# Patient Record
Sex: Male | Born: 1990 | Race: Black or African American | Hispanic: No | Marital: Married | Smoking: Never smoker
Health system: Southern US, Community
[De-identification: ages and names within clinical notes are randomized; demographics above are authoritative.]

---
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
## 2011-01-04 ENCOUNTER — Inpatient Hospital Stay (INDEPENDENT_AMBULATORY_CARE_PROVIDER_SITE_OTHER)
Admission: RE | Admit: 2011-01-04 | Discharge: 2011-01-04 | Disposition: A | Discharge status: Home / Self Care | Payer: Self-pay | Source: Ambulatory Visit | Attending: Emergency Medicine | Admitting: Emergency Medicine

## 2011-01-04 DIAGNOSIS — L738 Other specified follicular disorders: Secondary | ICD-10-CM

## 2011-01-04 DIAGNOSIS — R369 Urethral discharge, unspecified: Secondary | ICD-10-CM

## 2011-01-06 LAB — GC/CHLAMYDIA PROBE AMP, GENITAL: Chlamydia, DNA Probe: NEGATIVE

## 2011-04-10 ENCOUNTER — Emergency Department: Payer: Self-pay | Admitting: *Deleted

## 2011-06-01 LAB — GC/CHLAMYDIA PROBE AMP, GENITAL
Chlamydia, DNA Probe: NEGATIVE
GC Probe Amp, Genital: POSITIVE — AB

## 2011-06-01 LAB — HIV ANTIBODY (ROUTINE TESTING W REFLEX): HIV: NONREACTIVE

## 2012-09-08 ENCOUNTER — Encounter (HOSPITAL_COMMUNITY): Payer: Self-pay | Admitting: Emergency Medicine

## 2012-09-08 ENCOUNTER — Emergency Department (HOSPITAL_COMMUNITY)
Admission: EM | Admit: 2012-09-08 | Discharge: 2012-09-08 | Disposition: A | Discharge status: Home / Self Care | Payer: Self-pay | Attending: Emergency Medicine | Admitting: Emergency Medicine

## 2012-09-08 ENCOUNTER — Emergency Department (HOSPITAL_COMMUNITY): Payer: Self-pay

## 2012-09-08 DIAGNOSIS — R0789 Other chest pain: Secondary | ICD-10-CM | POA: Insufficient documentation

## 2012-09-08 NOTE — ED Provider Notes (Signed)
History     CSN: [id number]  Arrival date & time 09/08/12  1953   First MD Initiated Contact with Patient 09/08/12 2003      Chief Complaint  Patient presents with  . Chest Pain    (Consider location/radiation/quality/duration/timing/severity/associated sxs/prior treatment) HPI  Alan Sherman is a 22 y.o. male complaining of acute onset of chest pain earlier in the day while doing pushups. Patient had been taking powdered protein and vitamins and then proceeded to pushups. He describes the pain as 6/10, pressure like to the left upper chest that lasted for 2 minutes it is now fully resolved. Pain was associated with dizziness. Patient denies any shortness of breath, nausea vomiting, family history of early cardiac death, abdominal pain, palpitations, numbness, cocaine abuse.  Marland KitchenHistory reviewed. No pertinent past medical history.  History reviewed. No pertinent past surgical history.  No family history on file.  History  Substance Use Topics  . Smoking status: Not on file  . Smokeless tobacco: Not on file  . Alcohol Use: Not on file      Review of Systems  Constitutional: Negative for fever.  Respiratory: Negative for shortness of breath.   Cardiovascular: Negative for chest pain.  Gastrointestinal: Negative for nausea, vomiting, abdominal pain and diarrhea.  All other systems reviewed and are negative.    Allergies  Review of patient's allergies indicates no known allergies.  Home Medications   Current Outpatient Rx  Name  Route  Sig  Dispense  Refill  . ADULT MULTIVITAMIN W/MINERALS CH   Oral   Take 1 tablet by mouth daily.           There were no vitals taken for this visit.  Physical Exam  Nursing note and vitals reviewed. Constitutional: He is oriented to person, place, and time. He appears well-developed and well-nourished. No distress.  HENT:  Head: Normocephalic.  Eyes: Conjunctivae normal and EOM are normal.  Neck: Normal range of motion. Neck  supple. No JVD present.  Cardiovascular: Normal rate.   Pulmonary/Chest: Effort normal and breath sounds normal. No stridor. No respiratory distress. He has no wheezes. He has no rales. He exhibits no tenderness.  Musculoskeletal: Normal range of motion. He exhibits no edema.  Neurological: He is alert and oriented to person, place, and time.  Psychiatric: He has a normal mood and affect.    ED Course  Procedures (including critical care time)  Labs Reviewed - No data to display Dg Chest 2 View  09/08/2012  *RADIOLOGY REPORT*  Clinical Data: Chest pain.  CHEST - 2 VIEW  Comparison: 12/24/2010  Findings: Cardiac and mediastinal contours appear normal.  The lungs appear clear.  No pleural effusion is identified.  IMPRESSION:  No significant abnormality identified.   Original Report Authenticated By: Gaylyn Rong, M.D.      Date: 09/08/2012  Rate: 71  Rhythm: normal sinus rhythm  QRS Axis: normal  Intervals: normal  ST/T Wave abnormalities: normal  Conduction Disutrbances:none  Narrative Interpretation:   Old EKG Reviewed: unchanged   1. Atypical chest pain       MDM  Patient with no cardiac risk factors with 2 minutes of chest pain earlier in the day. Vital signs stable, EKG and chest x-ray are normal. Likely muscular skeletal in nature. Patient will be discharged with return precautions given.       Wynetta Emery, PA-C 09/08/12 2127

## 2012-09-08 NOTE — ED Notes (Signed)
Pt arrived by EMS. Pt c/o CP while doing push ups. Denies any CP at this time. Pt uncle told EMS that he smoked pot today but pt denies. Pt stated while doing push ups he heard a pop and when the CP started. Stated that he had some dizziness at the time of CP. Cp lasted approx 2 mins and went away. Pt also had a sharp CP that came and went en route to the hospital. EMS stated that pt had taken [employer] supplements and a extreme 4 supplement for the first time today. Pt denies N/V SOB.  EMS VS: BP-161/85 HR-84 O2sat- 100% RA.

## 2012-09-09 NOTE — ED Provider Notes (Signed)
Medical screening examination/treatment/procedure(s) were performed by non-physician practitioner and as supervising physician I was immediately available for consultation/collaboration.   Dam Ashraf L Gloristine Turrubiates, MD 09/09/12 1237 

## 2012-11-25 ENCOUNTER — Encounter (HOSPITAL_COMMUNITY): Payer: Self-pay | Admitting: *Deleted

## 2012-11-25 DIAGNOSIS — R51 Headache: Secondary | ICD-10-CM | POA: Insufficient documentation

## 2012-11-25 DIAGNOSIS — J3489 Other specified disorders of nose and nasal sinuses: Secondary | ICD-10-CM | POA: Insufficient documentation

## 2012-11-25 DIAGNOSIS — Z79899 Other long term (current) drug therapy: Secondary | ICD-10-CM | POA: Insufficient documentation

## 2012-11-25 NOTE — ED Notes (Signed)
Pt state HA that wont go away for a week now. Pt states that he has been trying OTC medications for the HA and that it will be severe and the right side of his head will hurt and eye will go blurry. Pt attempts to take medicine and he feels nauseated. Pt has no neuro deficits.

## 2012-11-26 ENCOUNTER — Emergency Department (HOSPITAL_COMMUNITY)
Admission: EM | Admit: 2012-11-26 | Discharge: 2012-11-26 | Disposition: A | Discharge status: Home / Self Care | Payer: Self-pay | Attending: Emergency Medicine | Admitting: Emergency Medicine

## 2012-11-26 DIAGNOSIS — R0981 Nasal congestion: Secondary | ICD-10-CM

## 2012-11-26 MED ORDER — IBUPROFEN 800 MG PO TABS
800.0000 mg | ORAL_TABLET | Freq: Once | ORAL | Status: AC
  Administered 2012-11-26: 800 mg via ORAL
  Filled 2012-11-26: qty 1

## 2012-11-26 MED ORDER — MOMETASONE FUROATE 50 MCG/ACT NA SUSP: 2.0000 | Freq: Every day | NASAL | Status: AC

## 2012-11-26 MED ORDER — TRAMADOL HCL 50 MG PO TABS
50.0000 mg | ORAL_TABLET | Freq: Once | ORAL | Status: AC
  Administered 2012-11-26: 50 mg via ORAL
  Filled 2012-11-26: qty 1

## 2012-11-26 MED ORDER — PSEUDOEPHEDRINE HCL ER 120 MG PO TB12: 120.0000 mg | ORAL_TABLET | Freq: Two times a day (BID) | ORAL | Status: AC

## 2012-11-26 MED ORDER — OXYMETAZOLINE HCL 0.05 % NA SOLN
1.0000 | Freq: Once | NASAL | Status: AC
  Administered 2012-11-26: 1 via NASAL
  Filled 2012-11-26: qty 15

## 2012-11-26 MED ORDER — IBUPROFEN 800 MG PO TABS: 800.0000 mg | ORAL_TABLET | Freq: Four times a day (QID) | ORAL | Status: AC | PRN

## 2012-11-26 MED ORDER — OXYMETAZOLINE HCL 0.05 % NA SOLN: 2.0000 | Freq: Two times a day (BID) | NASAL | Status: AC

## 2012-11-26 MED ORDER — PSEUDOEPHEDRINE HCL ER 120 MG PO TB12
120.0000 mg | ORAL_TABLET | ORAL | Status: AC
  Administered 2012-11-26: 120 mg via ORAL
  Filled 2012-11-26: qty 1

## 2012-11-26 MED ORDER — TRAMADOL HCL 50 MG PO TABS: 50.0000 mg | ORAL_TABLET | Freq: Three times a day (TID) | ORAL | Status: AC | PRN

## 2012-11-26 NOTE — ED Notes (Addendum)
Denies: sore throat.  throat mildly red and irritated, no obvious exudate, reports clear nasal congestion & sputum. C/o R sided HA and R facial pain tenderness, effecting R nose, ear and eye. Also, denies fever, nausea, dizziness or other sx.  Friend at [facility].

## 2012-11-26 NOTE — ED Notes (Addendum)
Back into room

## 2012-11-26 NOTE — ED Notes (Signed)
Pt not in room, went to w/r to get phone from uncle. Alert, NAD, calm, interactive, resps e/u, speaking in clear complete sentences, steady gait.

## 2012-11-26 NOTE — ED Provider Notes (Signed)
History     CSN: [id number]  Arrival date & time 11/25/12  2127   First MD Initiated Contact with Patient 11/26/12 0226      Chief Complaint  Patient presents with  . Headache    (Consider location/radiation/quality/duration/timing/severity/associated sxs/prior treatment) HPI 22 yo male presents to the ER with complaint of right sided headache x 1 week.  No fevers, chills, neck pain or stiffness.  Pt reports h/o allergies this time of year, and has had rhinorrhea.  Pain is over right eye, right lateral face, and ear.  He has tried OTC pain medications without improvement.  No allergy or decongestion medicine tried.  No neurologic complaints.  No photophobia, no phonophobia, no nausea, no tearing.    History reviewed. No pertinent past medical history.  History reviewed. No pertinent past surgical history.  History reviewed. No pertinent family history.  History  Substance Use Topics  . Smoking status: Never Smoker   . Smokeless tobacco: Not on file  . Alcohol Use: No      Review of Systems  All other systems reviewed and are negative.    Allergies  Review of patient's allergies indicates no known allergies.  Home Medications   Current Outpatient Rx  Name  Route  Sig  Dispense  Refill  . Multiple Vitamin (MULTIVITAMIN WITH MINERALS) TABS   Oral   Take 1 tablet by mouth daily.         Marland Kitchen ibuprofen (ADVIL,MOTRIN) 800 MG tablet   Oral   Take 1 tablet (800 mg total) by mouth every 6 (six) hours as needed for pain.   30 tablet   0   . mometasone (NASONEX) 50 MCG/ACT nasal spray   Nasal   Place 2 sprays into the nose daily.   17 g   12   . oxymetazoline (AFRIN) 0.05 % nasal spray   Nasal   Place 2 sprays into the nose 2 (two) times daily. For no more than 3 days   30 mL   0   . pseudoephedrine (SUDAFED) 120 MG 12 hr tablet   Oral   Take 1 tablet (120 mg total) by mouth every 12 (twelve) hours.   30 tablet   0   . traMADol (ULTRAM) 50 MG tablet  Oral   Take 1 tablet (50 mg total) by mouth every 8 (eight) hours as needed for pain.   30 tablet   0     BP 139/71  Pulse 56  Temp(Src) 98 F (36.7 C) (Oral)  Resp 16  SpO2 100%  Physical Exam  Nursing note and vitals reviewed. Constitutional: He is oriented to person, place, and time. He appears well-developed and well-nourished.  HENT:  Head: Normocephalic and atraumatic.  Left Ear: External ear normal.  Nose: Nose normal.  Mouth/Throat: Oropharynx is clear and moist.  Clear fluid behind right TM.  Pain with percussion over right frontal and maxillary sinus.  Inflamed turbinates bilaterally  Eyes: Conjunctivae and EOM are normal. Pupils are equal, round, and reactive to light.  Neck: Normal range of motion. Neck supple. No JVD present. No tracheal deviation present. No thyromegaly present.  Cardiovascular: Normal rate, regular rhythm, normal heart sounds and intact distal pulses.  Exam reveals no gallop and no friction rub.   No murmur heard. Pulmonary/Chest: Effort normal and breath sounds normal. No stridor. No respiratory distress. He has no wheezes. He has no rales. He exhibits no tenderness.  Abdominal: Soft. Bowel sounds are normal. He exhibits no distension  and no mass. There is no tenderness. There is no rebound and no guarding.  Musculoskeletal: Normal range of motion. He exhibits no edema and no tenderness.  Lymphadenopathy:    He has no cervical adenopathy.  Neurological: He is oriented to person, place, and time. He has normal reflexes. No cranial nerve deficit. He exhibits normal muscle tone. Coordination normal.  Skin: Skin is dry. No rash noted. No erythema. No pallor.  Psychiatric: He has a normal mood and affect. His behavior is normal. Judgment and thought content normal.    ED Course  Procedures (including critical care time)  Labs Reviewed - No data to display No results found.   1. Sinus headache   2. Sinus congestion       MDM  22 yo male  with headache, most likely due to sinus pressure.  Rx given.  No signs of meningitis, migraine, or serious infection.        Olivia Mackie, MD 11/26/12 2036

## 2015-06-30 ENCOUNTER — Encounter: Payer: Self-pay | Admitting: Emergency Medicine

## 2015-06-30 ENCOUNTER — Emergency Department
Admission: EM | Admit: 2015-06-30 | Discharge: 2015-06-30 | Disposition: A | Discharge status: Home / Self Care | Payer: Self-pay | Attending: Emergency Medicine | Admitting: Emergency Medicine

## 2015-06-30 DIAGNOSIS — B349 Viral infection, unspecified: Secondary | ICD-10-CM | POA: Insufficient documentation

## 2015-06-30 DIAGNOSIS — Z79899 Other long term (current) drug therapy: Secondary | ICD-10-CM | POA: Insufficient documentation

## 2015-06-30 DIAGNOSIS — Z7951 Long term (current) use of inhaled steroids: Secondary | ICD-10-CM | POA: Insufficient documentation

## 2015-06-30 DIAGNOSIS — L02419 Cutaneous abscess of limb, unspecified: Secondary | ICD-10-CM

## 2015-06-30 DIAGNOSIS — L02411 Cutaneous abscess of right axilla: Secondary | ICD-10-CM | POA: Insufficient documentation

## 2015-06-30 LAB — CBC WITH DIFFERENTIAL/PLATELET
BASOS ABS: 0 10*3/uL (ref 0–0.1)
Basophils Relative: 0 %
Eosinophils Absolute: 0 10*3/uL (ref 0–0.7)
Eosinophils Relative: 0 %
HEMATOCRIT: 49 % (ref 40.0–52.0)
Hemoglobin: 16.3 g/dL (ref 13.0–18.0)
LYMPHS PCT: 9 %
Lymphs Abs: 0.8 10*3/uL — ABNORMAL LOW (ref 1.0–3.6)
MCH: 28.4 pg (ref 26.0–34.0)
MCHC: 33.3 g/dL (ref 32.0–36.0)
MCV: 85.1 fL (ref 80.0–100.0)
Monocytes Absolute: 0.6 10*3/uL (ref 0.2–1.0)
Monocytes Relative: 6 %
NEUTROS ABS: 8.1 10*3/uL — AB (ref 1.4–6.5)
Neutrophils Relative %: 85 %
Platelets: 133 10*3/uL — ABNORMAL LOW (ref 150–440)
RBC: 5.76 MIL/uL (ref 4.40–5.90)
RDW: 13.7 % (ref 11.5–14.5)
WBC: 9.5 10*3/uL (ref 3.8–10.6)

## 2015-06-30 LAB — BASIC METABOLIC PANEL
ANION GAP: 10 (ref 5–15)
BUN: 13 mg/dL (ref 6–20)
CO2: 28 mmol/L (ref 22–32)
Calcium: 9.8 mg/dL (ref 8.9–10.3)
Chloride: 101 mmol/L (ref 101–111)
Creatinine, Ser: 1.21 mg/dL (ref 0.61–1.24)
GFR calc Af Amer: >60 mL/min (ref >60)
GLUCOSE: 86 mg/dL (ref 65–99)
POTASSIUM: 3.5 mmol/L (ref 3.5–5.1)
SODIUM: 139 mmol/L (ref 135–145)

## 2015-06-30 LAB — URINALYSIS COMPLETE WITH MICROSCOPIC (ARMC ONLY)
Bacteria, UA: NONE SEEN
Bilirubin Urine: NEGATIVE
Glucose, UA: NEGATIVE mg/dL
Hgb urine dipstick: NEGATIVE
Leukocytes, UA: NEGATIVE
Nitrite: NEGATIVE
PROTEIN: NEGATIVE mg/dL
Specific Gravity, Urine: 1.021 (ref 1.005–1.030)
Squamous Epithelial / LPF: NONE SEEN
pH: 8 (ref 5.0–8.0)

## 2015-06-30 MED ORDER — SODIUM CHLORIDE 0.9 % IV BOLUS (SEPSIS): 1000.0000 mL | Freq: Once | INTRAVENOUS | Status: DC

## 2015-06-30 MED ORDER — ORPHENADRINE CITRATE 30 MG/ML IJ SOLN
60.0000 mg | Freq: Once | INTRAMUSCULAR | Status: AC
  Administered 2015-06-30: 60 mg via INTRAVENOUS
  Filled 2015-06-30: qty 2

## 2015-06-30 MED ORDER — TRAMADOL HCL 50 MG PO TABS: 50.0000 mg | ORAL_TABLET | Freq: Four times a day (QID) | ORAL | Status: AC | PRN

## 2015-06-30 MED ORDER — ACETAMINOPHEN 325 MG PO TABS
650.0000 mg | ORAL_TABLET | Freq: Once | ORAL | Status: AC
  Administered 2015-06-30: 650 mg via ORAL

## 2015-06-30 MED ORDER — SULFAMETHOXAZOLE-TRIMETHOPRIM 800-160 MG PO TABS: 1.0000 | ORAL_TABLET | Freq: Two times a day (BID) | ORAL | Status: DC

## 2015-06-30 MED ORDER — ACETAMINOPHEN 325 MG PO TABS
ORAL_TABLET | ORAL | Status: AC
  Filled 2015-06-30: qty 2

## 2015-06-30 MED ORDER — SODIUM CHLORIDE 0.9 % IV BOLUS (SEPSIS)
1000.0000 mL | Freq: Once | INTRAVENOUS | Status: AC
  Administered 2015-06-30: 1000 mL via INTRAVENOUS

## 2015-06-30 MED ORDER — SULFAMETHOXAZOLE-TRIMETHOPRIM 800-160 MG PO TABS
1.0000 | ORAL_TABLET | Freq: Once | ORAL | Status: AC
  Administered 2015-06-30: 1 via ORAL
  Filled 2015-06-30: qty 1

## 2015-06-30 MED ORDER — KETOROLAC TROMETHAMINE 30 MG/ML IJ SOLN
30.0000 mg | Freq: Once | INTRAMUSCULAR | Status: AC
  Administered 2015-06-30: 30 mg via INTRAVENOUS
  Filled 2015-06-30: qty 1

## 2015-06-30 MED ORDER — HYDROMORPHONE HCL 1 MG/ML IJ SOLN
1.0000 mg | Freq: Once | INTRAMUSCULAR | Status: AC
  Administered 2015-06-30: 1 mg via INTRAVENOUS
  Filled 2015-06-30: qty 1

## 2015-06-30 NOTE — Discharge Instructions (Signed)
Abscess °An abscess (boil or furuncle) is an infected area on or under the skin. This area is filled with yellowish-white fluid (pus) and other material (debris). °HOME CARE  °· Only take medicines as told by your doctor. °· If you were given antibiotic medicine, take it as directed. Finish the medicine even if you start to feel better. °· If gauze is used, follow your doctor's directions for changing the gauze. °· To avoid spreading the infection: °¨ Keep your abscess covered with a bandage. °¨ Wash your hands well. °¨ Do not share personal care items, towels, or whirlpools with others. °¨ Avoid skin contact with others. °· Keep your skin and clothes clean around the abscess. °· Keep all doctor visits as told. °GET HELP RIGHT AWAY IF:  °· You have more pain, puffiness (swelling), or redness in the wound site. °· You have more fluid or blood coming from the wound site. °· You have muscle aches, chills, or you feel sick. °· You have a fever. °MAKE SURE YOU:  °· Understand these instructions. °· Will watch your condition. °· Will get help right away if you are not doing well or get worse. °  °This information is not intended to replace advice given to you by your health care provider. Make sure you discuss any questions you have with your health care provider. °  °Document Released: 01/17/2008 Document Revised: 01/30/2012 Document Reviewed: 10/14/2011 °Elsevier Interactive Patient Education ©2016 [employer] ° °

## 2015-06-30 NOTE — ED Provider Notes (Signed)
[facility] Emergency Department Provider Note  ____________________________________________  Time seen: Approximately 8:24 PM  I have reviewed the triage vital signs and the nursing notes.   HISTORY  Chief Complaint Fatigue    HPI Faaris S Meuser is a 24 y.o. male patient state he is feeling weak and achy for 4 days. Patient state he had a borders right axillary area Friday and "pop" the lesion. The next day he started feeling weak and achy. Patient states she's had intermittent fever and chills. Patient also complaining of nausea and vomiting. Patient denies diarrhea. No palliative measures for complaint.   History reviewed. No pertinent past medical history.  There are no active problems to display for this patient.   History reviewed. No pertinent past surgical history.  Current Outpatient Rx  Name  Route  Sig  Dispense  Refill  . ibuprofen (ADVIL,MOTRIN) 800 MG tablet   Oral   Take 1 tablet (800 mg total) by mouth every 6 (six) hours as needed for pain.   30 tablet   0   . mometasone (NASONEX) 50 MCG/ACT nasal spray   Nasal   Place 2 sprays into the nose daily.   17 g   12   . Multiple Vitamin (MULTIVITAMIN WITH MINERALS) TABS   Oral   Take 1 tablet by mouth daily.         Marland Kitchen oxymetazoline (AFRIN) 0.05 % nasal spray   Nasal   Place 2 sprays into the nose 2 (two) times daily. For no more than 3 days   30 mL   0   . pseudoephedrine (SUDAFED) 120 MG 12 hr tablet   Oral   Take 1 tablet (120 mg total) by mouth every 12 (twelve) hours.   30 tablet   0   . sulfamethoxazole-trimethoprim (BACTRIM DS,SEPTRA DS) 800-160 MG tablet   Oral   Take 1 tablet by mouth 2 (two) times daily.   20 tablet   0   . traMADol (ULTRAM) 50 MG tablet   Oral   Take 1 tablet (50 mg total) by mouth every 8 (eight) hours as needed for pain.   30 tablet   0   . traMADol (ULTRAM) 50 MG tablet   Oral   Take 1 tablet (50 mg total) by mouth every 6 (six)  hours as needed for moderate pain.   12 tablet   0     Allergies Review of patient's allergies indicates no known allergies.  History reviewed. No pertinent family history.  Social History Social History  Substance Use Topics  . Smoking status: Never Smoker   . Smokeless tobacco: None  . Alcohol Use: No    Review of Systems Constitutional: Fever and chills. Fatigue Eyes: No visual changes. ENT: No sore throat. Cardiovascular: Denies chest pain. Respiratory: Denies shortness of breath. Gastrointestinal: No abdominal pain.  Nausea and vomiting.  No diarrhea.  No constipation. Genitourinary: Negative for dysuria. Musculoskeletal: Negative for back pain. Skin: Negative for rash. Edema and discharge from the right axillary area. Neurological: Negative for headaches, focal weakness or numbness. 10-point ROS otherwise negative.  ____________________________________________   PHYSICAL EXAM:  VITAL SIGNS: ED Triage Vitals  Enc Vitals Group     BP 06/30/15 2001 138/60 mmHg     Pulse Rate 06/30/15 2001 71     Resp 06/30/15 2001 18     Temp 06/30/15 2001 99.7 F (37.6 C)     Temp Source 06/30/15 2001 Oral     SpO2  06/30/15 2001 100 %     Weight 06/30/15 2001 175 lb (79.379 kg)     Height 06/30/15 2001 5\' 9"  (1.753 m)     Head Cir --      Peak Flow --      Pain Score 06/30/15 2007 10     Pain Loc --      Pain Edu? --      Excl. in GC? --     Constitutional: Alert and oriented. Well appearing and in no acute distress. Eyes: Conjunctivae are normal. PERRL. EOMI. Head: Atraumatic. Nose: No congestion/rhinnorhea. Mouth/Throat: Mucous membranes are moist.  Oropharynx non-erythematous. Neck: No stridor.  No cervical spine tenderness to palpation. Hematological/Lymphatic/Immunilogical: Right axillary lymphadenopathy. Cardiovascular: Normal rate, regular rhythm. Grossly normal heart sounds.  Good peripheral circulation. Respiratory: Normal respiratory effort.  No  retractions. Lungs CTAB. Gastrointestinal: Soft and nontender. No distention. No abdominal bruits. No CVA tenderness. Musculoskeletal: No lower extremity tenderness nor edema.  No joint effusions. Neurologic:  Normal speech and language. No gross focal neurologic deficits are appreciated. No gait instability. Skin:  Skin is warm, dry and intact. No rash noted. Papular lesion right axillary area. Lesion is nonfluctuant. Psychiatric: Mood and affect are normal. Speech and behavior are normal.  ____________________________________________   LABS (all labs ordered are listed, but only abnormal results are displayed)  Labs Reviewed  URINALYSIS COMPLETEWITH MICROSCOPIC ([facility] ONLY) - Abnormal; Notable for the following:    Color, Urine YELLOW (*)    APPearance CLEAR (*)    Ketones, ur TRACE (*)    All other components within normal limits  CBC WITH DIFFERENTIAL/PLATELET - Abnormal; Notable for the following:    Platelets 133 (*)    Neutro Abs 8.1 (*)    Lymphs Abs 0.8 (*)    All other components within normal limits  BASIC METABOLIC PANEL   ____________________________________________  EKG   ____________________________________________  RADIOLOGY   ____________________________________________   PROCEDURES  Procedure(s) performed: None  Critical Care performed: No  ____________________________________________   INITIAL IMPRESSION / ASSESSMENT AND PLAN / ED COURSE  Pertinent labs & imaging results that were available during my care of the patient were reviewed by me and considered in my medical decision making (see chart for details). Abscess right axillary area. Patient also has a viral illness. Patient developed a fever while in the ER. Patient given IV hydration, Tylenol, Toradol and Dilaudid. Patient will be discharged prescription for Bactrim DS and tramadol. Patient given a work note for today and tomorrow. Patient advised [facility]  condition persists.  FINAL CLINICAL IMPRESSION(S) / ED DIAGNOSES  Final diagnoses:  Abscess of axillary region  Viral illness      Joni ReiningRonald K Jonai Weyland, PA-C 06/30/15 2258  Myrna Blazeravid Matthew Schaevitz, MD 06/30/15 2329

## 2015-06-30 NOTE — ED Notes (Signed)
Patient states that he "popped" a boil in his right axilla Friday. Patient reports that started feeling weak and achy all over Sunday. Patient states that the weakness and achy became worse today.

## 2017-05-24 ENCOUNTER — Emergency Department
Admission: EM | Admit: 2017-05-24 | Discharge: 2017-05-24 | Disposition: A | Discharge status: Home / Self Care | Payer: Self-pay | Attending: Emergency Medicine | Admitting: Emergency Medicine

## 2017-05-24 ENCOUNTER — Emergency Department: Payer: Self-pay

## 2017-05-24 ENCOUNTER — Encounter: Payer: Self-pay | Admitting: Emergency Medicine

## 2017-05-24 DIAGNOSIS — R112 Nausea with vomiting, unspecified: Secondary | ICD-10-CM | POA: Insufficient documentation

## 2017-05-24 DIAGNOSIS — Z79899 Other long term (current) drug therapy: Secondary | ICD-10-CM | POA: Insufficient documentation

## 2017-05-24 DIAGNOSIS — R1084 Generalized abdominal pain: Secondary | ICD-10-CM | POA: Insufficient documentation

## 2017-05-24 DIAGNOSIS — R197 Diarrhea, unspecified: Secondary | ICD-10-CM

## 2017-05-24 LAB — COMPREHENSIVE METABOLIC PANEL
ALT: 26 U/L (ref 17–63)
ANION GAP: 14 (ref 5–15)
AST: 54 U/L — AB (ref 15–41)
Albumin: 4.5 g/dL (ref 3.5–5.0)
Alkaline Phosphatase: 63 U/L (ref 38–126)
BILIRUBIN TOTAL: 0.7 mg/dL (ref 0.3–1.2)
BUN: 15 mg/dL (ref 6–20)
CO2: 23 mmol/L (ref 22–32)
Calcium: 9.4 mg/dL (ref 8.9–10.3)
Chloride: 98 mmol/L — ABNORMAL LOW (ref 101–111)
Creatinine, Ser: 1.36 mg/dL — ABNORMAL HIGH (ref 0.61–1.24)
GFR calc Af Amer: >60 mL/min (ref >60)
Glucose, Bld: 80 mg/dL (ref 65–99)
POTASSIUM: 3.6 mmol/L (ref 3.5–5.1)
Sodium: 135 mmol/L (ref 135–145)
TOTAL PROTEIN: 8.5 g/dL — AB (ref 6.5–8.1)

## 2017-05-24 LAB — URINALYSIS, COMPLETE (UACMP) WITH MICROSCOPIC
BACTERIA UA: NONE SEEN
BILIRUBIN URINE: NEGATIVE
GLUCOSE, UA: NEGATIVE mg/dL
Ketones, ur: 20 mg/dL — AB
LEUKOCYTES UA: NEGATIVE
NITRITE: NEGATIVE
PH: 5 (ref 5.0–8.0)
Protein, ur: 30 mg/dL — AB
SPECIFIC GRAVITY, URINE: 1.031 — AB (ref 1.005–1.030)

## 2017-05-24 LAB — CBC
HEMATOCRIT: 51.3 % (ref 40.0–52.0)
Hemoglobin: 17.3 g/dL (ref 13.0–18.0)
MCH: 27.9 pg (ref 26.0–34.0)
MCHC: 33.7 g/dL (ref 32.0–36.0)
MCV: 82.8 fL (ref 80.0–100.0)
Platelets: 183 10*3/uL (ref 150–440)
RBC: 6.19 MIL/uL — ABNORMAL HIGH (ref 4.40–5.90)
RDW: 13.7 % (ref 11.5–14.5)
WBC: 3.9 10*3/uL (ref 3.8–10.6)

## 2017-05-24 LAB — RAPID HIV SCREEN (HIV 1/2 AB+AG)
HIV 1/2 Antibodies: NONREACTIVE
HIV-1 P24 ANTIGEN - HIV24: NONREACTIVE

## 2017-05-24 LAB — LIPASE, BLOOD: Lipase: 19 U/L (ref 11–51)

## 2017-05-24 MED ORDER — IOPAMIDOL (ISOVUE-300) INJECTION 61%
100.0000 mL | Freq: Once | INTRAVENOUS | Status: AC | PRN
  Administered 2017-05-24: 100 mL via INTRAVENOUS
  Filled 2017-05-24: qty 100

## 2017-05-24 MED ORDER — SODIUM CHLORIDE 0.9 % IV BOLUS (SEPSIS)
1000.0000 mL | Freq: Once | INTRAVENOUS | Status: AC
  Administered 2017-05-24: 1000 mL via INTRAVENOUS

## 2017-05-24 MED ORDER — ONDANSETRON 4 MG PO TBDP
4.0000 mg | ORAL_TABLET | Freq: Once | ORAL | Status: AC | PRN
  Administered 2017-05-24: 4 mg via ORAL
  Filled 2017-05-24: qty 1

## 2017-05-24 MED ORDER — ONDANSETRON HCL 4 MG/2ML IJ SOLN
4.0000 mg | Freq: Once | INTRAMUSCULAR | Status: AC
  Administered 2017-05-24: 4 mg via INTRAVENOUS
  Filled 2017-05-24: qty 2

## 2017-05-24 MED ORDER — ONDANSETRON 4 MG PO TBDP: 4.0000 mg | ORAL_TABLET | Freq: Three times a day (TID) | ORAL | 0 refills | Status: AC | PRN

## 2017-05-24 NOTE — ED Notes (Signed)
Pt ambulatory at discharge. VSS. Skin warm and dry. Pt and significant other verbalized understanding of discharge instructions and prescription. Signature pad not working; signed paper copy in medical records bin.

## 2017-05-24 NOTE — ED Provider Notes (Signed)
[facility] Emergency Department Provider Note  ____________________________________________  Time seen: Approximately 3:14 PM  I have reviewed the triage vital signs and the nursing notes.   HISTORY  Chief Complaint Emesis   HPI Alan Sherman is a 26 y.o. male with no significant past medical history who presents for evaluation of nausea, vomiting, diarrhea. Patient reports 1 week of symptoms 2 weeks ago. Felt better for a week and symptoms have resumed. Endorses 10-15 pound weight loss. Reports several hourly episodes of nonbloody nonbilious emesis and watery diarrhea. No known sick contact exposures. Also complaining of diffuse cramping intermittent mild sometimes severe abdominal pain. No recent antibiotic use, no history of C. difficile. No fever but has had chills. No dysuria or hematuria. No chest pain or shortness of breath.  History reviewed. No pertinent past medical history.  There are no active problems to display for this patient.   History reviewed. No pertinent surgical history.  Prior to Admission medications   Medication Sig Start Date End Date Taking? Authorizing Provider  ibuprofen (ADVIL,MOTRIN) 800 MG tablet Take 1 tablet (800 mg total) by mouth every 6 (six) hours as needed for pain. 11/26/12   Marisa Severin, MD  mometasone (NASONEX) 50 MCG/ACT nasal spray Place 2 sprays into the nose daily. 11/26/12   Marisa Severin, MD  Multiple Vitamin (MULTIVITAMIN WITH MINERALS) TABS Take 1 tablet by mouth daily.    [provider]  ondansetron (ZOFRAN ODT) 4 MG disintegrating tablet Take 1 tablet (4 mg total) by mouth every 8 (eight) hours as needed for nausea or vomiting. 05/24/17   Nita Sickle, MD  oxymetazoline (AFRIN) 0.05 % nasal spray Place 2 sprays into the nose 2 (two) times daily. For no more than 3 days 11/26/12   Marisa Severin, MD  pseudoephedrine (SUDAFED) 120 MG 12 hr tablet Take 1 tablet (120 mg total) by mouth every 12  (twelve) hours. 11/26/12   Marisa Severin, MD  sulfamethoxazole-trimethoprim (BACTRIM DS,SEPTRA DS) 800-160 MG tablet Take 1 tablet by mouth 2 (two) times daily. 06/30/15   Joni Reining, PA-C  traMADol (ULTRAM) 50 MG tablet Take 1 tablet (50 mg total) by mouth every 8 (eight) hours as needed for pain. 11/26/12   Marisa Severin, MD  traMADol (ULTRAM) 50 MG tablet Take 1 tablet (50 mg total) by mouth every 6 (six) hours as needed for moderate pain. 06/30/15   Joni Reining, PA-C    Allergies Patient has no known allergies.  History reviewed. No pertinent family history.  Social History Social History  Substance Use Topics  . Smoking status: Never Smoker  . Smokeless tobacco: Not on file  . Alcohol use No    Review of Systems  Constitutional: Negative for fever. + chills Eyes: Negative for visual changes. ENT: Negative for sore throat. Neck: No neck pain  Cardiovascular: Negative for chest pain. Respiratory: Negative for shortness of breath. Gastrointestinal: + cramping abdominal pain, vomiting and diarrhea. Genitourinary: Negative for dysuria. Musculoskeletal: Negative for back pain. Skin: Negative for rash. Neurological: Negative for headaches, weakness or numbness. Psych: No SI or HI  ____________________________________________   PHYSICAL EXAM:  VITAL SIGNS: ED Triage Vitals  Enc Vitals Group     BP 05/24/17 1437 (!) 134/93     Pulse --      Resp 05/24/17 1436 16     Temp 05/24/17 1436 98 F (36.7 C)     Temp Source 05/24/17 1436 Oral     SpO2 05/24/17 1436 100 %  Weight 05/24/17 1437 165 lb (74.8 kg)     Height 05/24/17 1437  (1.753 m)     Head Circumference --      Peak Flow --      Pain Score 05/24/17 1435 10     Pain Loc --      Pain Edu? --      Excl. in GC? --     Constitutional: Alert and oriented. Well appearing and in no apparent distress. HEENT:      Head: Normocephalic and atraumatic.         Eyes: Conjunctivae are normal. Sclera is  non-icteric.       Mouth/Throat: Mucous membranes are dry.       Neck: Supple with no signs of meningismus. Cardiovascular: Regular rate and rhythm. No murmurs, gallops, or rubs. 2+ symmetrical distal pulses are present in all extremities. No JVD. Respiratory: Normal respiratory effort. Lungs are clear to auscultation bilaterally. No wheezes, crackles, or rhonchi.  Gastrointestinal: Soft, diffuse mild ttp worse in the epigastric region, and non distended with positive bowel sounds. No rebound or guarding. Musculoskeletal: Nontender with normal range of motion in all extremities. No edema, cyanosis, or erythema of extremities. Neurologic: Normal speech and language. Face is symmetric. Moving all extremities. No gross focal neurologic deficits are appreciated. Skin: Skin is warm, dry and intact. No rash noted. Psychiatric: Mood and affect are normal. Speech and behavior are normal.  ____________________________________________   LABS (all labs ordered are listed, but only abnormal results are displayed)  Labs Reviewed  COMPREHENSIVE METABOLIC PANEL - Abnormal; Notable for the following:       Result Value   Chloride 98 (*)    Creatinine, Ser 1.36 (*)    Total Protein 8.5 (*)    AST 54 (*)    All other components within normal limits  CBC - Abnormal; Notable for the following:    RBC 6.19 (*)    All other components within normal limits  URINALYSIS, COMPLETE (UACMP) WITH MICROSCOPIC - Abnormal; Notable for the following:    Color, Urine YELLOW (*)    APPearance CLEAR (*)    Specific Gravity, Urine 1.031 (*)    Hgb urine dipstick SMALL (*)    Ketones, ur 20 (*)    Protein, ur 30 (*)    Squamous Epithelial / LPF 0-5 (*)    All other components within normal limits  C DIFFICILE QUICK SCREEN W PCR REFLEX  LIPASE, BLOOD  RAPID HIV SCREEN (HIV 1/2 AB+AG)   ____________________________________________  EKG  none ____________________________________________  RADIOLOGY  CT ap:  negative  ____________________________________________   PROCEDURES  Procedure(s) performed: None Procedures Critical Care performed:  None ____________________________________________   INITIAL IMPRESSION / ASSESSMENT AND PLAN / ED COURSE  a 26 y.o. male with no significant past medical history who presents for evaluation of nausea, vomiting, diarrhea x 1 week and 15lb weight loss. No sick contacts, no abx. patient is well-appearing, normal vital signs, does look dry and exam, his abdomen is soft with no rebound or guarding, patient has mild diffuse tenderness throughout worse in the epigastrium. labs show a normal lipase, slightly increased AST of 54, leukopenia with normal ANC. UA showing ketones. Will check HIV, get CT a/p, give IVF and IV zofran    _________________________ 4:40 PM on 05/24/2017 ----------------------------------------- HIV negative. CT with no acute findings. No vomiting or diarrhea in the ED. Patient tolerating PO. Remains well appearing. Will dc on zofran, increased PO hydration, f/u with  PCP. Discussed return precautions.   As part of my medical decision making, I reviewed the following data within the electronic MEDICAL RECORD NUMBER History obtained from family, Nursing notes reviewed and incorporated, Labs reviewed , Radiograph reviewed , Notes from prior ED visits and [place] Controlled Substance Database    Pertinent labs & imaging results that were available during my care of the patient were reviewed by me and considered in my medical decision making (see chart for details).    ____________________________________________   FINAL CLINICAL IMPRESSION(S) / ED DIAGNOSES  Final diagnoses:  Nausea vomiting and diarrhea  Generalized abdominal pain      NEW MEDICATIONS STARTED DURING THIS VISIT:  New Prescriptions   ONDANSETRON (ZOFRAN ODT) 4 MG DISINTEGRATING TABLET    Take 1 tablet (4 mg total) by mouth every 8 (eight) hours as needed for nausea or  vomiting.     Note:  This document was prepared using Dragon voice recognition software and may include unintentional dictation errors.    Nita Sickle, MD 05/24/17 [phone]

## 2017-05-24 NOTE — ED Notes (Signed)
Patient transported to CT 

## 2017-05-24 NOTE — ED Notes (Signed)
Patient returned from radiology

## 2017-05-24 NOTE — ED Notes (Signed)
Pt given water to drink. 

## 2017-05-24 NOTE — ED Notes (Signed)
Pt given water and saltine crackers.  

## 2017-05-24 NOTE — ED Triage Notes (Signed)
Pt c/o vomiting last week that got better then started again this week.  Cannot keep anything down. Generalized abdominal cramping.  Ambulatory to triage. VSS.  Reports lost 10-15 lbs in last 2 weeks.  Skin warm and dry in triage.  No vomiting while in triage. No fevers.

## 2017-05-24 NOTE — ED Notes (Signed)
Pt ambulatory from lobby to ED31

## 2018-01-13 ENCOUNTER — Emergency Department
Admission: EM | Admit: 2018-01-13 | Discharge: 2018-01-13 | Disposition: A | Discharge status: Home / Self Care | Payer: Self-pay | Attending: Emergency Medicine | Admitting: Emergency Medicine

## 2018-01-13 ENCOUNTER — Encounter: Payer: Self-pay | Admitting: Emergency Medicine

## 2018-01-13 DIAGNOSIS — R519 Headache, unspecified: Secondary | ICD-10-CM

## 2018-01-13 DIAGNOSIS — R51 Headache: Secondary | ICD-10-CM | POA: Insufficient documentation

## 2018-01-13 DIAGNOSIS — F17200 Nicotine dependence, unspecified, uncomplicated: Secondary | ICD-10-CM | POA: Insufficient documentation

## 2018-01-13 DIAGNOSIS — Z79899 Other long term (current) drug therapy: Secondary | ICD-10-CM | POA: Insufficient documentation

## 2018-01-13 LAB — BASIC METABOLIC PANEL
ANION GAP: 8 (ref 5–15)
BUN: 12 mg/dL (ref 6–20)
CHLORIDE: 105 mmol/L (ref 101–111)
CO2: 27 mmol/L (ref 22–32)
Calcium: 9 mg/dL (ref 8.9–10.3)
Creatinine, Ser: 1.39 mg/dL — ABNORMAL HIGH (ref 0.61–1.24)
GFR calc Af Amer: >60 mL/min (ref >60)
GLUCOSE: 97 mg/dL (ref 65–99)
POTASSIUM: 4.1 mmol/L (ref 3.5–5.1)
Sodium: 140 mmol/L (ref 135–145)

## 2018-01-13 LAB — CBC
HCT: 50.8 % (ref 40.0–52.0)
HEMOGLOBIN: 16.5 g/dL (ref 13.0–18.0)
MCH: 27.9 pg (ref 26.0–34.0)
MCHC: 32.4 g/dL (ref 32.0–36.0)
MCV: 85.9 fL (ref 80.0–100.0)
PLATELETS: 126 10*3/uL — AB (ref 150–440)
RBC: 5.91 MIL/uL — AB (ref 4.40–5.90)
RDW: 14.1 % (ref 11.5–14.5)
WBC: 4.5 10*3/uL (ref 3.8–10.6)

## 2018-01-13 MED ORDER — DIPHENHYDRAMINE HCL 50 MG/ML IJ SOLN
25.0000 mg | Freq: Once | INTRAMUSCULAR | Status: AC
  Administered 2018-01-13: 25 mg via INTRAVENOUS
  Filled 2018-01-13: qty 1

## 2018-01-13 MED ORDER — KETOROLAC TROMETHAMINE 30 MG/ML IJ SOLN
30.0000 mg | Freq: Once | INTRAMUSCULAR | Status: AC
  Administered 2018-01-13: 30 mg via INTRAVENOUS
  Filled 2018-01-13: qty 1

## 2018-01-13 MED ORDER — SODIUM CHLORIDE 0.9 % IV BOLUS
1000.0000 mL | Freq: Once | INTRAVENOUS | Status: AC
  Administered 2018-01-13: 1000 mL via INTRAVENOUS

## 2018-01-13 MED ORDER — MAGNESIUM SULFATE 2 GM/50ML IV SOLN
2.0000 g | Freq: Once | INTRAVENOUS | Status: AC
  Administered 2018-01-13: 2 g via INTRAVENOUS
  Filled 2018-01-13: qty 50

## 2018-01-13 MED ORDER — METOCLOPRAMIDE HCL 5 MG/ML IJ SOLN
10.0000 mg | Freq: Once | INTRAMUSCULAR | Status: AC
  Administered 2018-01-13: 10 mg via INTRAVENOUS
  Filled 2018-01-13: qty 2

## 2018-01-13 MED ORDER — BUTALBITAL-APAP-CAFFEINE 50-325-40 MG PO TABS: 1.0000 | ORAL_TABLET | Freq: Four times a day (QID) | ORAL | 0 refills | Status: AC | PRN

## 2018-01-13 NOTE — ED Notes (Addendum)
Pt reports the pain persists but states that he believes the pain is coming from him being tired. Pt does not have insurance and does not want IV, blood work, or medications before talking to the doctor.   MD made aware. Supplies at bedside and lights dimmed at this time.

## 2018-01-13 NOTE — ED Provider Notes (Signed)
[facility] Emergency Department Provider Note   ____________________________________________   First MD Initiated Contact with Patient 01/13/18 0202     (approximate)  I have reviewed the triage vital signs and the nursing notes.   HISTORY  Chief Complaint Headache    HPI Alan Sherman is a 27 y.o. male who comes into the hospital today with a headache.  The patient states that at his job he was switched from second shift to third shift and has been having a hard time adjusting.  He reports that this is a month ago but he is been having hard time sleeping the past 2 weeks.  The patient states that he has been having headaches since then.  He has been taking ibuprofen but it has not been helping as much.  He also states that he took so much ibuprofen that he was having some palpitations and was nervous to go to sleep.  He was at work [employer] and he asked if he could go home early because of his headache.  He reports that his job sent him home and told him he needed to come to the ER and come back with a work note.  The patient states that this is all due to fatigue but is here because his job sent him in.  The patient states that his pain is 8 out of 10 in intensity.  He denies any nausea or vomiting but has had some blurred vision.  The patient denies any numbness tingling or weakness.  He is here today for evaluation.  History reviewed. No pertinent past medical history.  There are no active problems to display for this patient.   History reviewed. No pertinent surgical history.  Prior to Admission medications   Medication Sig Start Date End Date Taking? Authorizing Provider  butalbital-acetaminophen-caffeine (FIORICET, ESGIC) [phone] MG tablet Take 1-2 tablets by mouth every 6 (six) hours as needed for headache. 01/13/18 01/13/19  Rebecka ApleyWebster, Tysha Grismore P, MD  ibuprofen (ADVIL,MOTRIN) 800 MG tablet Take 1 tablet (800 mg total) by mouth every 6 (six) hours as  needed for pain. 11/26/12   Marisa Severintter, Olga, MD  mometasone (NASONEX) 50 MCG/ACT nasal spray Place 2 sprays into the nose daily. 11/26/12   Marisa Severintter, Olga, MD  Multiple Vitamin (MULTIVITAMIN WITH MINERALS) TABS Take 1 tablet by mouth daily.    [provider]  ondansetron (ZOFRAN ODT) 4 MG disintegrating tablet Take 1 tablet (4 mg total) by mouth every 8 (eight) hours as needed for nausea or vomiting. 05/24/17   Nita SickleVeronese, [place], MD  oxymetazoline (AFRIN) 0.05 % nasal spray Place 2 sprays into the nose 2 (two) times daily. For no more than 3 days 11/26/12   Marisa Severintter, Olga, MD  pseudoephedrine (SUDAFED) 120 MG 12 hr tablet Take 1 tablet (120 mg total) by mouth every 12 (twelve) hours. 11/26/12   Marisa Severintter, Olga, MD  sulfamethoxazole-trimethoprim (BACTRIM DS,SEPTRA DS) 800-160 MG tablet Take 1 tablet by mouth 2 (two) times daily. 06/30/15   Joni ReiningSmith, Ronald K, PA-C  traMADol (ULTRAM) 50 MG tablet Take 1 tablet (50 mg total) by mouth every 8 (eight) hours as needed for pain. 11/26/12   Marisa Severintter, Olga, MD  traMADol (ULTRAM) 50 MG tablet Take 1 tablet (50 mg total) by mouth every 6 (six) hours as needed for moderate pain. 06/30/15   Joni ReiningSmith, Ronald K, PA-C    Allergies Patient has no known allergies.  No family history on file.  Social History Social History   Tobacco  Use  . Smoking status: Current Some Day Smoker  . Smokeless tobacco: Never Used  Substance Use Topics  . Alcohol use: No  . Drug use: No    Review of Systems  Constitutional: No fever/chills Eyes: Blurred vision  ENT: No sore throat. Cardiovascular: Denies chest pain. Respiratory: Denies shortness of breath. Gastrointestinal: No abdominal pain.  No nausea, no vomiting.  No diarrhea.  No constipation. Genitourinary: Negative for dysuria. Musculoskeletal: Negative for back pain. Skin: Negative for rash. Neurological: Headache   ____________________________________________   PHYSICAL EXAM:  VITAL SIGNS: ED Triage Vitals  Enc  Vitals Group     BP 01/13/18 0032 (!) 155/76     Pulse Rate 01/13/18 0032 65     Resp 01/13/18 0032 20     Temp 01/13/18 0032 98.6 F (37 C)     Temp Source 01/13/18 0032 Oral     SpO2 01/13/18 0032 100 %     Weight 01/13/18 0028 185 lb (83.9 kg)     Height 01/13/18 0028 5\' 9"  (1.753 m)     Head Circumference --      Peak Flow --      Pain Score 01/13/18 0028 8     Pain Loc --      Pain Edu? --      Excl. in GC? --     Constitutional: Alert and oriented. Well appearing and in moderate distress. Eyes: Conjunctivae are normal. PERRL. EOMI. Head: Atraumatic. Nose: No congestion/rhinnorhea. Mouth/Throat: Mucous membranes are moist.  Oropharynx non-erythematous. Neck: Supple with no meningismus Cardiovascular: Normal rate, regular rhythm. Grossly normal heart sounds.  Good peripheral circulation. Respiratory: Normal respiratory effort.  No retractions. Lungs CTAB. Gastrointestinal: Soft and nontender. No distention.  Positive bowel sounds  Musculoskeletal: No lower extremity tenderness nor edema.   Neurologic:  Normal speech and language.  Cranial nerves II through XII are grossly intact with no focal motor neuro deficit  Skin:  Skin is warm, dry and intact.  Psychiatric: Mood and affect are normal.   ____________________________________________   LABS (all labs ordered are listed, but only abnormal results are displayed)  Labs Reviewed  CBC - Abnormal; Notable for the following components:      Result Value   RBC 5.91 (*)    Platelets 126 (*)    All other components within normal limits  BASIC METABOLIC PANEL - Abnormal; Notable for the following components:   Creatinine, Ser 1.39 (*)    All other components within normal limits   ____________________________________________  EKG  none ____________________________________________  RADIOLOGY  ED MD interpretation:  none  Official radiology report(s): No results  found.  ____________________________________________   PROCEDURES  Procedure(s) performed: None  Procedures  Critical Care performed: No  ____________________________________________   INITIAL IMPRESSION / ASSESSMENT AND PLAN / ED COURSE  As part of my medical decision making, I reviewed the following data within the electronic MEDICAL RECORD NUMBER Notes from prior ED visits and [place] Controlled Substance Database  This is a 27 year old male who comes into the hospital today with a headache.  The patient was sent by his job for evaluation and a doctor's note.  The patient states that he does not have insurance and initially did not want any work-up.  He states that he will take some medicine and some blood work but he does not want any imaging studies.  I did give the patient some Reglan and Benadryl as well as a liter of normal saline.  He said that  his headache is mildly improved so then I gave him some Toradol and magnesium.  The patient's headache was much improved.  He did not want any imaging studies as he states that it was going to be too expensive and he cannot afford it.  I did inform the patient that he should return with any worsening symptoms because he will need some imaging if his pain persist.  He will be discharged home to follow-up with the acute care clinic.     ____________________________________________   FINAL CLINICAL IMPRESSION(S) / ED DIAGNOSES  Final diagnoses:  Acute nonintractable headache, unspecified headache type     ED Discharge Orders        Ordered    butalbital-acetaminophen-caffeine (FIORICET, ESGIC) 50-325-40 MG tablet  Every 6 hours PRN     01/13/18 0538       Note:  This document was prepared using Dragon voice recognition software and may include unintentional dictation errors.    Rebecka Apley, MD 01/13/18 [phone]

## 2018-01-13 NOTE — Discharge Instructions (Addendum)
Please follow up with your primary care physician or the acute care clinic for further evaluation. You opted not to receive a CT scan this evening. If your headaches persist please return for advanced imaging studies.

## 2018-01-13 NOTE — ED Triage Notes (Signed)
Patient reports having headaches off/on for the past month and it is affecting his sleep.  Reports left from work tonight and they told him to return he would need a doctor's note.

## 2018-03-21 IMAGING — CT CT ABD-PELV W/ CM
2 of 4 series · 15 of 46 positions shown, 17 images · IV contrast (APPLIED)
Comparison: None.

CLINICAL DATA: Vomiting and generalized abdominal cramping.

EXAM:
CT ABDOMEN AND PELVIS WITH CONTRAST
TECHNIQUE: Multidetector CT imaging of the abdomen and pelvis was performed
using the standard protocol following bolus administration of
intravenous contrast.
CONTRAST:  100mL LQWFRC-RFF IOPAMIDOL (LQWFRC-RFF) INJECTION 61%

[Series 2: routine abd/pel with · axial · 0.67mm/px · z∈[-944,-544]mm · 12 of 92 slices shown, 14 images]
[im 8/92  soft-tissue]
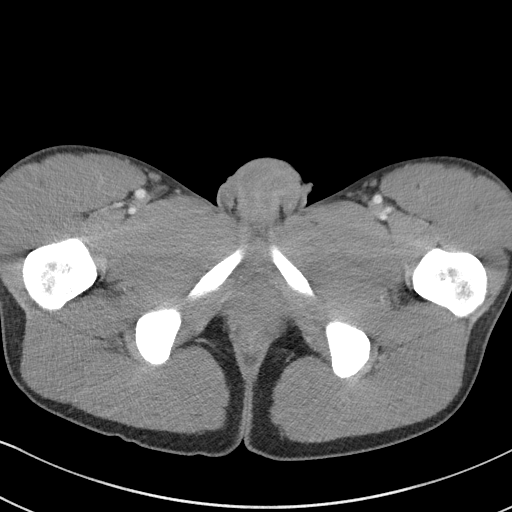
[im 8/92  bone]
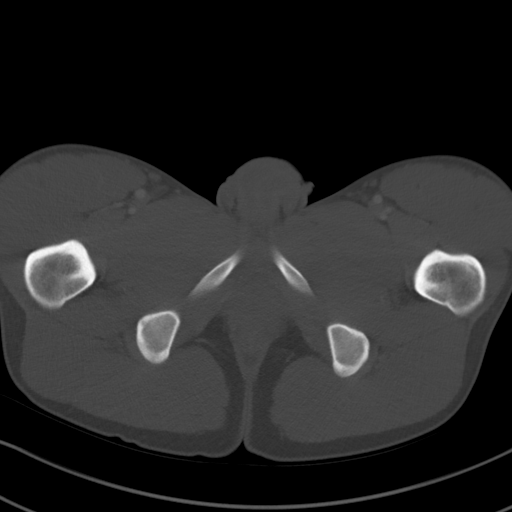
[im 15/92  soft-tissue]
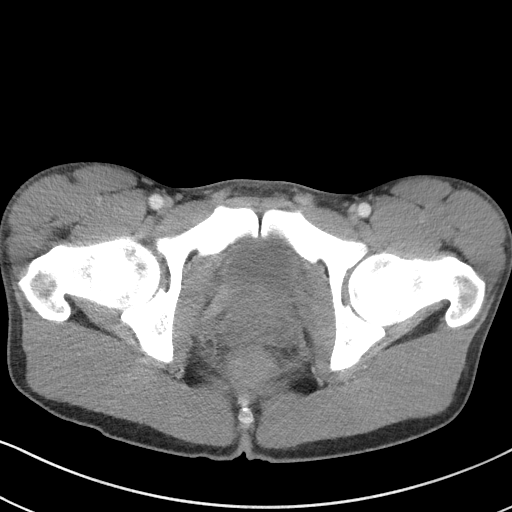
[im 22/92  soft-tissue]
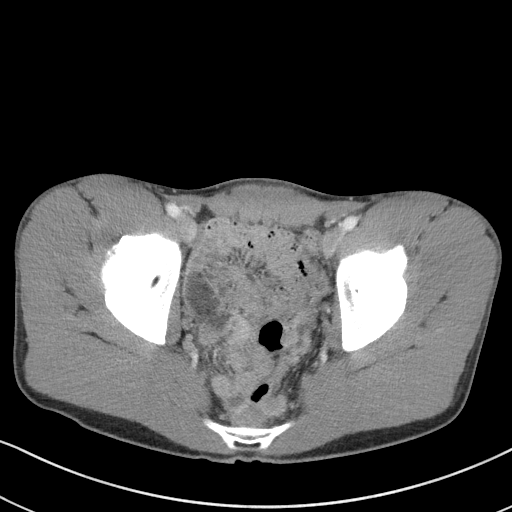
[im 30/92  soft-tissue]
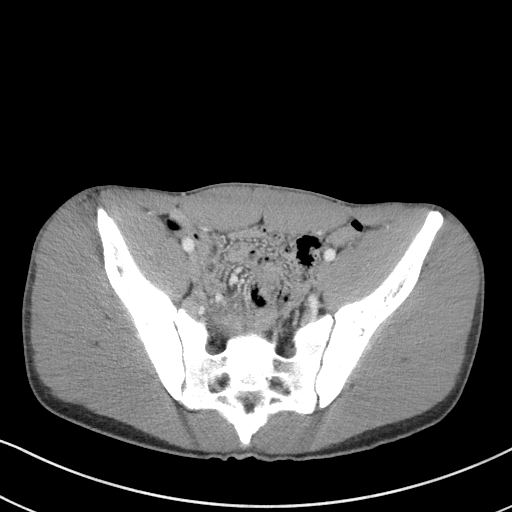
[im 37/92  soft-tissue]
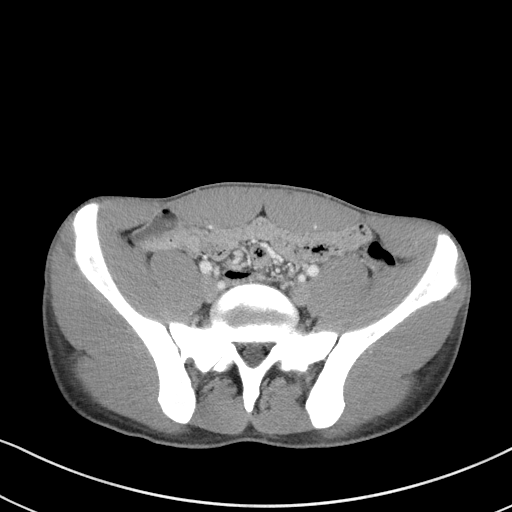
[im 44/92  soft-tissue]
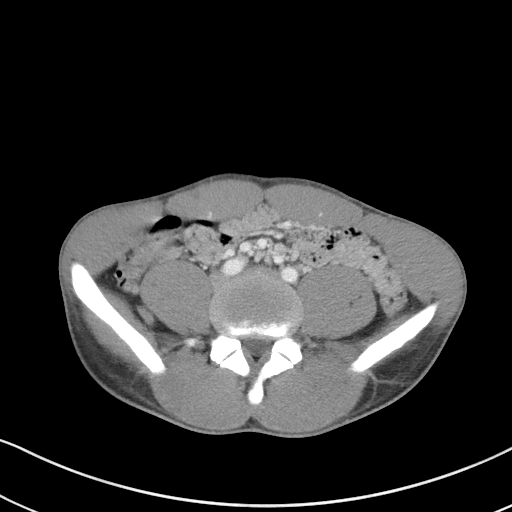
[im 51/92  soft-tissue]
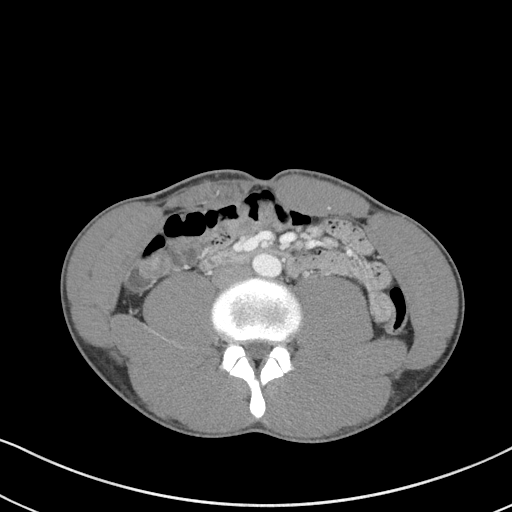
[im 59/92  soft-tissue]
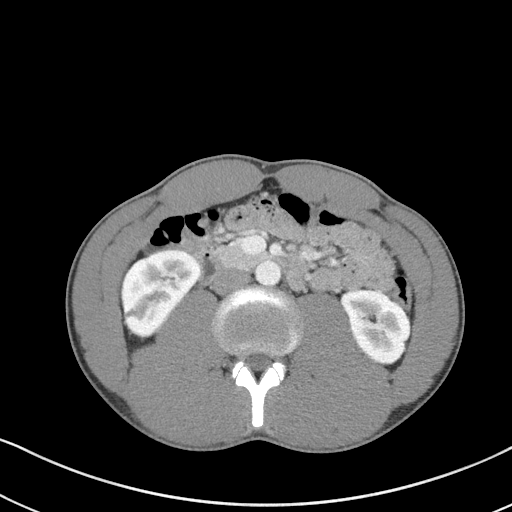
[im 66/92  soft-tissue]
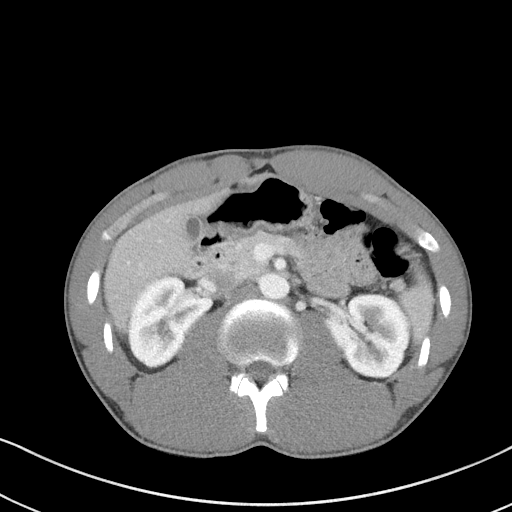
[im 66/92  bone]
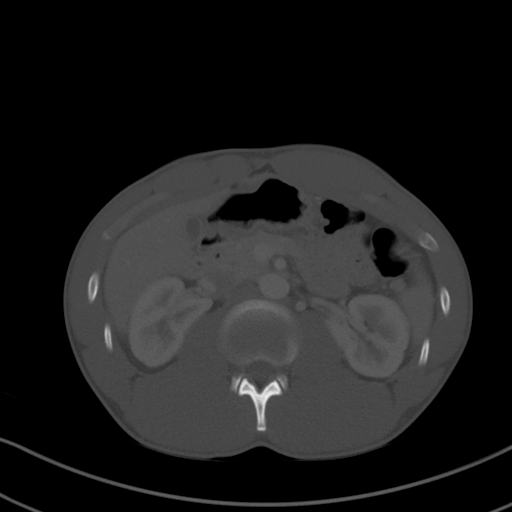
[im 73/92  soft-tissue]
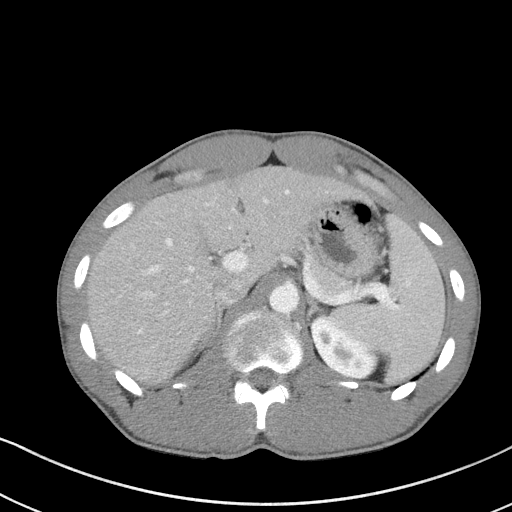
[im 81/92  soft-tissue]
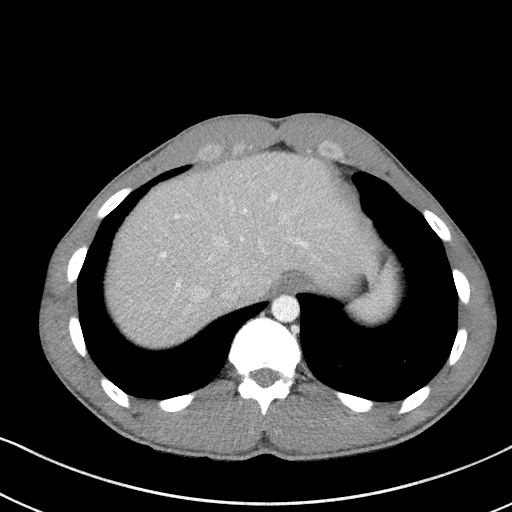
[im 88/92  soft-tissue]
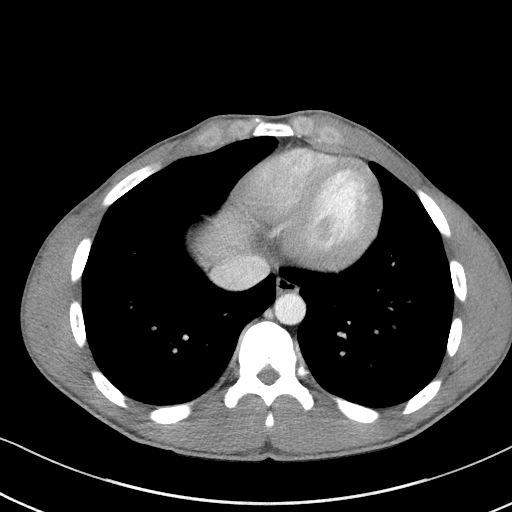

[Series 5: coronal st · coronal · 0.61mm/px · 3 of 75 slices shown]
[im 25/75  soft-tissue]
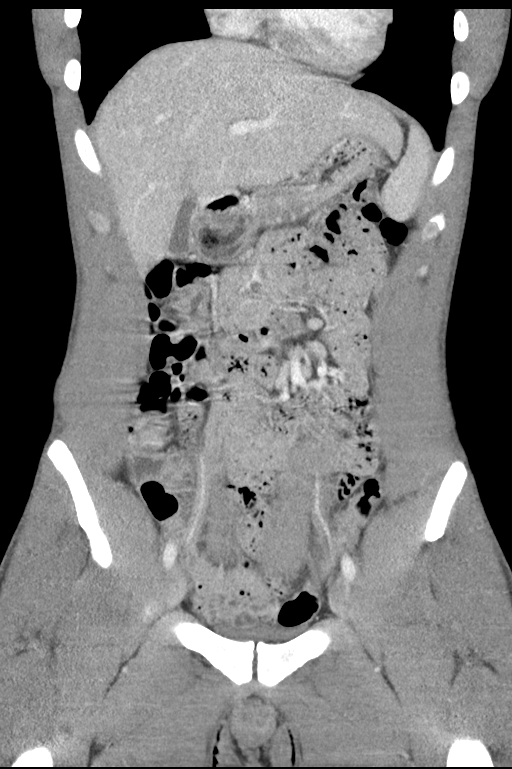
[im 33/75  soft-tissue]
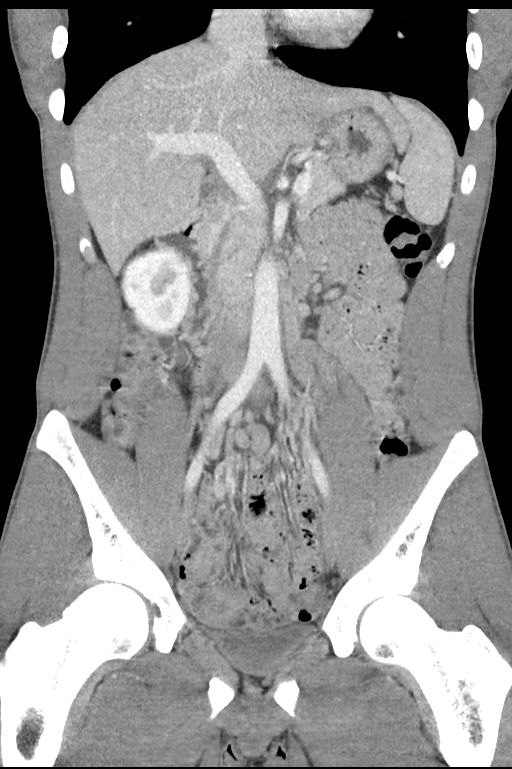
[im 42/75  soft-tissue]
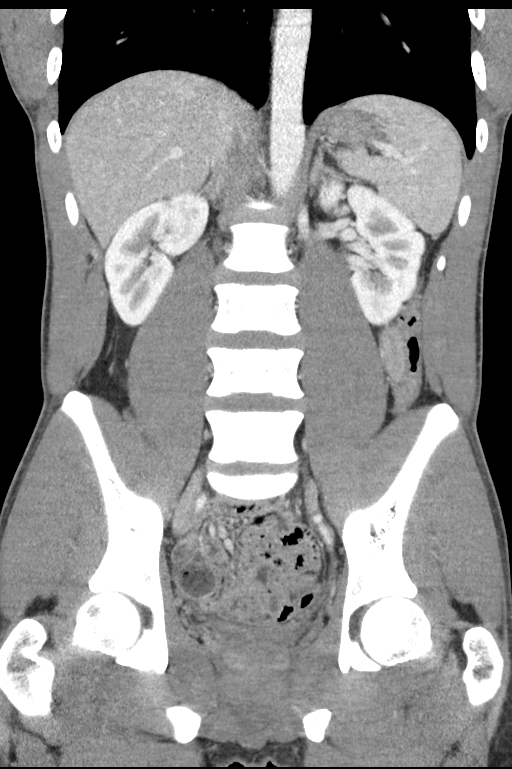

[15 of 46 positions shown; findings below may reference images not displayed]

FINDINGS: Lower chest: No acute abnormality.

Hepatobiliary: No focal liver abnormality is seen. No gallstones,
gallbladder wall thickening, or biliary dilatation.

Pancreas: Unremarkable. No pancreatic ductal dilatation or
surrounding inflammatory changes.

Spleen: Normal in size without focal abnormality.

Adrenals/Urinary Tract: Adrenal glands are unremarkable. Kidneys are
normal, without renal calculi, focal lesion, or hydronephrosis.
Bladder is decompressed.

Stomach/Bowel: Stomach is within normal limits. Appendix is not
definitively seen, however there are no secondary signs of
inflammation in the right lower quadrant. No evidence of bowel wall
thickening, distention, or inflammatory changes.

Vascular/Lymphatic: No significant vascular findings are present. No
enlarged abdominal or pelvic lymph nodes. Prominent subcentimeter
left mesenteric lymph nodes are likely reactive.

Reproductive: Prostate is unremarkable.

Other: No free fluid or pneumoperitoneum.  No abdominal wall hernia.

Musculoskeletal: No acute or significant osseous findings.
IMPRESSION: 1. No acute intra-abdominal process.

## 2021-02-04 ENCOUNTER — Other Ambulatory Visit: Payer: Self-pay

## 2021-02-04 ENCOUNTER — Ambulatory Visit: Payer: Self-pay | Admitting: Physician Assistant

## 2021-02-04 DIAGNOSIS — Z113 Encounter for screening for infections with a predominantly sexual mode of transmission: Secondary | ICD-10-CM

## 2021-02-04 DIAGNOSIS — N341 Nonspecific urethritis: Secondary | ICD-10-CM

## 2021-02-04 LAB — GRAM STAIN

## 2021-02-05 ENCOUNTER — Encounter: Payer: Self-pay | Admitting: Physician Assistant

## 2021-02-05 MED ORDER — DOXYCYCLINE HYCLATE 100 MG PO TABS: 100.0000 mg | ORAL_TABLET | Freq: Two times a day (BID) | ORAL | 0 refills | Status: AC

## 2021-02-05 NOTE — Progress Notes (Signed)
[facility] Department STI clinic/screening visit  Subjective:  Alan Sherman is a 30 y.o. male being seen today for an STI screening visit. The patient reports they do have symptoms.    Patient has the following medical conditions:  There are no problems to display for this patient.    Chief Complaint  Patient presents with   SEXUALLY TRANSMITTED DISEASE    screening    HPI  Patient reports that he has had a clear discharge with slight itching inside his urethra for 2 days.  Denies other symptoms, chronic conditions, surgeries and regular medicines.  Reports that his last HIV test was 6-7 years ago and last void prior to sample collection for Gram stain was over 2 hr ago.   See flowsheet for further details and programmatic requirements.    The following portions of the patient's history were reviewed and updated as appropriate: allergies, current medications, past medical history, past social history, past surgical history and problem list.  Objective:  There were no vitals filed for this visit.  Physical Exam Constitutional:      General: He is not in acute distress.    Appearance: Normal appearance.  HENT:     Head: Normocephalic and atraumatic.     Comments: No nits,lice, or hair loss. No cervical, supraclavicular or axillary adenopathy.     Mouth/Throat:     Mouth: Mucous membranes are moist.     Pharynx: Oropharynx is clear. No oropharyngeal exudate or posterior oropharyngeal erythema.  Eyes:     Conjunctiva/sclera: Conjunctivae normal.  Pulmonary:     Effort: Pulmonary effort is normal.  Abdominal:     Palpations: Abdomen is soft. There is no mass.     Tenderness: There is no abdominal tenderness. There is no guarding or rebound.  Genitourinary:    Penis: Normal.      Testes: Normal.     Comments: Pubic area without nits, lice, hair loss, edema, erythema, lesions and inguinal adenopathy. Penis circumcised without rash, lesions and discharge  at meatus. Testicles descended bilaterally,nt, no masses or edema.  Musculoskeletal:     Cervical back: Neck supple. No tenderness.  Skin:    General: Skin is warm and dry.     Findings: No bruising, erythema, lesion or rash.  Neurological:     Mental Status: He is alert and oriented to person, place, and time.  Psychiatric:        Mood and Affect: Mood normal.        Behavior: Behavior normal.        Thought Content: Thought content normal.        Judgment: Judgment normal.      Assessment and Plan:  Alan Sherman is a 30 y.o. male presenting to the [facility] Department for STI screening  1. Screening for STD (sexually transmitted disease) Patient into clinic with symptoms. Reviewed with patient Gram stain results and need for treatment.  Rec condoms with all sex. Await test results.  Counseled that RN will call if needs to RTC for treatment once results are back.  - Gram stain - Gonococcus culture - HIV [place] [facility] - Syphilis Serology, [facility]  2. NGU (nongonococcal urethritis) Treat NGU with Doxycycline 100 mg #14 1 po BID for 7 days. No sex for 14 days and until after partner completes treatment. Call with questions or concerns.   - doxycycline (VIBRA-TABS) 100 MG tablet; Take 1 tablet (100 mg total) by mouth 2 (  two) times daily for 7 days.  Dispense: 14 tablet; Refill: 0     No follow-ups on file.  No future appointments.  Matt Holmes, PA

## 2021-02-07 NOTE — Progress Notes (Signed)
Chart reviewed by Pharmacist  Suzanne Walker PharmD, Contract Pharmacist at [facility]  

## 2021-02-09 LAB — GONOCOCCUS CULTURE

## 2021-04-04 ENCOUNTER — Encounter: Payer: Self-pay | Admitting: Physician Assistant

## 2021-04-04 ENCOUNTER — Ambulatory Visit: Payer: Self-pay | Admitting: Physician Assistant

## 2021-04-04 ENCOUNTER — Other Ambulatory Visit: Payer: Self-pay

## 2021-04-04 DIAGNOSIS — A5401 Gonococcal cystitis and urethritis, unspecified: Secondary | ICD-10-CM

## 2021-04-04 DIAGNOSIS — Z113 Encounter for screening for infections with a predominantly sexual mode of transmission: Secondary | ICD-10-CM

## 2021-04-04 LAB — GRAM STAIN

## 2021-04-04 MED ORDER — DOXYCYCLINE HYCLATE 100 MG PO TABS
100.0000 mg | ORAL_TABLET | Freq: Two times a day (BID) | ORAL | 0 refills | Status: AC
Start: 2021-04-04 — End: 2021-04-11

## 2021-04-04 MED ORDER — CEFTRIAXONE SODIUM 500 MG IJ SOLR
500.0000 mg | Freq: Once | INTRAMUSCULAR | Status: AC
  Administered 2021-04-04: 500 mg via INTRAMUSCULAR

## 2021-04-04 NOTE — Progress Notes (Signed)
Pt here for STD screening.  Gram Stain results reviewed and medication dispensed per Provider orders.  Ceftriaxone 500 mg given without any complications.  Pt declined condoms. Berdie Ogren, RN

## 2021-04-04 NOTE — Progress Notes (Signed)
[facility] Department STI clinic/screening visit  Subjective:  Alan Sherman is a 30 y.o. male being seen today for an STI screening visit. The patient reports they do have symptoms.    Patient has the following medical conditions:  There are no problems to display for this patient.    Chief Complaint  Patient presents with   SEXUALLY TRANSMITTED DISEASE    Screening    HPI  Patient reports that he has had yellow penile discharge for 4 days with dysuria.  Denies other symptoms, chronic conditions, surgeries and regular medicines.  Reports last HIV test was 2 months ago and last void prior to sample collection for Gram stain was less than 2 hr ago.    See flowsheet for further details and programmatic requirements.    The following portions of the patient's history were reviewed and updated as appropriate: allergies, current medications, past medical history, past social history, past surgical history and problem list.  Objective:  There were no vitals filed for this visit.  Physical Exam Constitutional:      General: He is not in acute distress.    Appearance: Normal appearance.  HENT:     Head: Normocephalic and atraumatic.     Comments: No nits,lice, or hair loss. No cervical, supraclavicular or axillary adenopathy.     Mouth/Throat:     Mouth: Mucous membranes are moist.     Pharynx: Oropharynx is clear. No oropharyngeal exudate or posterior oropharyngeal erythema.  Eyes:     Conjunctiva/sclera: Conjunctivae normal.  Pulmonary:     Effort: Pulmonary effort is normal.  Abdominal:     Palpations: Abdomen is soft. There is no mass.     Tenderness: There is no abdominal tenderness. There is no guarding or rebound.  Genitourinary:    Penis: Normal.      Testes: Normal.     Comments: Pubic area without nits, lice, hair loss, edema, erythema, lesions and inguinal adenopathy. Penis circumcised without rash, lesions and small amount of clear discharge at  meatus. Testicles descended bilaterally,nt, no masses or edema.  Musculoskeletal:     Cervical back: Neck supple. No tenderness.  Skin:    General: Skin is warm and dry.     Findings: No bruising, erythema, lesion or rash.  Neurological:     Mental Status: He is alert and oriented to person, place, and time.  Psychiatric:        Mood and Affect: Mood normal.        Behavior: Behavior normal.        Thought Content: Thought content normal.        Judgment: Judgment normal.      Assessment and Plan:  Alan Sherman is a 30 y.o. male presenting to the [facility] Department for STI screening  1. Screening for STD (sexually transmitted disease) Patient into clinic with symptoms. Patient declines blood work today. Reviewed Gram stain results. Rec condoms with all sex. Await test results.  Counseled that RN will call if needs to RTC for treatment once results are back.  - Gram stain - Gonococcus culture  2. Gonococcal urethritis in male Treat for GC and cover for Chlamydia with Ceftriaxone 500 mg IM and Doxycycline 100 mg #14 1 po BID for 7 days. No sex for 14 days and until after partner/s complete treatment. Call with questions or concerns. - cefTRIAXone (ROCEPHIN) injection 500 mg - doxycycline (VIBRA-TABS) 100 MG tablet; Take 1 tablet (100 mg total) by  mouth 2 (two) times daily for 7 days.  Dispense: 14 tablet; Refill: 0     No follow-ups on file.  No future appointments.  Matt Holmes, PA

## 2021-08-12 ENCOUNTER — Ambulatory Visit: Payer: Self-pay

## 2021-08-22 ENCOUNTER — Encounter: Payer: Self-pay | Admitting: Family Medicine

## 2021-08-22 ENCOUNTER — Other Ambulatory Visit: Payer: Self-pay

## 2021-08-22 ENCOUNTER — Ambulatory Visit: Payer: Self-pay | Admitting: Family Medicine

## 2021-08-22 DIAGNOSIS — Z113 Encounter for screening for infections with a predominantly sexual mode of transmission: Secondary | ICD-10-CM

## 2021-08-22 DIAGNOSIS — Z202 Contact with and (suspected) exposure to infections with a predominantly sexual mode of transmission: Secondary | ICD-10-CM

## 2021-08-22 MED ORDER — CEFTRIAXONE SODIUM 500 MG IJ SOLR
500.0000 mg | Freq: Once | INTRAMUSCULAR | Status: AC
  Administered 2021-08-22: 500 mg via INTRAMUSCULAR

## 2021-08-22 MED ORDER — DOXYCYCLINE HYCLATE 100 MG PO TABS: 100.0000 mg | ORAL_TABLET | Freq: Two times a day (BID) | ORAL | 0 refills | Status: AC

## 2021-08-22 NOTE — Progress Notes (Signed)
[facility] Department STI clinic/screening visit  Subjective:  Alan Sherman is a 31 y.o. male being seen today for an STI screening visit. The patient reports they do have symptoms.    Patient has the following medical conditions:  There are no problems to display for this patient.    Chief Complaint  Patient presents with   SEXUALLY TRANSMITTED DISEASE    HPI  Patient reports contact to gonorrhea. Has had s/sx for ~ 2 weeks.    Does the patient or their partner desires a pregnancy in the next year? No  Screening for MPX risk: Does the patient have an unexplained rash? No Is the patient MSM? No Does the patient endorse multiple sex partners or anonymous sex partners? Yes Did the patient have close or sexual contact with a person diagnosed with MPX? No Has the patient traveled outside the [place] where MPX is endemic? No Is there a high clinical suspicion for MPX-- evidenced by one of the following No  -Unlikely to be chickenpox  -Lymphadenopathy  -Rash that present in same phase of evolution on any given body part   See flowsheet for further details and programmatic requirements.    The following portions of the patient's history were reviewed and updated as appropriate: allergies, current medications, past medical history, past social history, past surgical history and problem list.  Objective:  There were no vitals filed for this visit.  Physical Exam Constitutional:      Appearance: Normal appearance.  HENT:     Head: Normocephalic.     Mouth/Throat:     Mouth: Mucous membranes are moist.     Pharynx: Oropharynx is clear. No oropharyngeal exudate.  Pulmonary:     Effort: Pulmonary effort is normal.  Genitourinary:    Comments: Deferred  Musculoskeletal:     Cervical back: Normal range of motion.  Lymphadenopathy:     Cervical: No cervical adenopathy.  Skin:    General: Skin is warm and dry.     Findings: No bruising, erythema, lesion or rash.      Comments: Tattoos present   Neurological:     Mental Status: He is alert and oriented to person, place, and time.  Psychiatric:        Mood and Affect: Mood normal.        Behavior: Behavior normal.      Assessment and Plan:  Alan Sherman is a 31 y.o. male presenting to the [facility] Department for STI screening  1. Screening examination for venereal disease Patient does have STI symptoms Patient not screened d/t taking some of a friends antibiotics.  ~3 days worth.  Patient had sex with the same partner as friend that shared the antibiotics with him.  Pt informed that his friend is not treated d/t not completing the medications and to encourage his friend to RTC.   Patient meets criteria for HepB screening? Yes. Ordered? No - declined  Patient meets criteria for HepC screening? Yes. Ordered? No - declined  Recommended condom use with all sex Discussed importance of condom use for STI prevent Recommended no sex for the next 14 days.   Condoms given and encouraged.   Recommended returning for continued or worsening symptoms.    2. Exposure to gonorrhea Treat for exposure to gonorrhea and unknown chlamydia  - cefTRIAXone (ROCEPHIN) injection 500 mg - doxycycline (VIBRA-TABS) 100 MG tablet; Take 1 tablet (100 mg total) by mouth 2 (two) times daily for 7 days.  Dispense: 14 tablet; Refill: 0  Return for as needed.  No future appointments.  Wendi Snipes, FNP

## 2021-08-22 NOTE — Progress Notes (Signed)
Patient seen for STD treatment. Medication orders charted and given. Condoms given.

## 2021-09-20 ENCOUNTER — Other Ambulatory Visit: Payer: Self-pay

## 2021-09-20 ENCOUNTER — Encounter: Payer: Self-pay | Admitting: Family Medicine

## 2021-09-20 ENCOUNTER — Ambulatory Visit: Payer: Self-pay | Admitting: Family Medicine

## 2021-09-20 DIAGNOSIS — Z299 Encounter for prophylactic measures, unspecified: Secondary | ICD-10-CM

## 2021-09-20 DIAGNOSIS — Z113 Encounter for screening for infections with a predominantly sexual mode of transmission: Secondary | ICD-10-CM

## 2021-09-20 LAB — GRAM STAIN

## 2021-09-20 MED ORDER — METRONIDAZOLE 500 MG PO TABS: 2000.0000 mg | ORAL_TABLET | Freq: Once | ORAL | 0 refills | Status: AC

## 2021-09-20 NOTE — Progress Notes (Signed)
Pt here for STD screening.  Gram stain results reviewed.  Medication dispensed per Provider orders.  Pt declined condoms. Renso Swett M Lyza Houseworth, RN  

## 2021-09-21 NOTE — Progress Notes (Signed)
[facility] Department STI clinic/screening visit  Subjective:  Alan Sherman is a 31 y.o. male being seen today for an STI screening visit. The patient reports they do have symptoms.    Patient has the following medical conditions:  There are no problems to display for this patient.    Chief Complaint  Patient presents with   SEXUALLY TRANSMITTED DISEASE    Screening    HPI  Patient reports here for screening, reports still having s/sx   Does the patient or their partner desires a pregnancy in the next year? No  Screening for MPX risk: Does the patient have an unexplained rash? No Is the patient MSM? No Does the patient endorse multiple sex partners or anonymous sex partners? Yes Did the patient have close or sexual contact with a person diagnosed with MPX? No Has the patient traveled outside the [place] where MPX is endemic? No Is there a high clinical suspicion for MPX-- evidenced by one of the following No  -Unlikely to be chickenpox  -Lymphadenopathy  -Rash that present in same phase of evolution on any given body part   See flowsheet for further details and programmatic requirements.    The following portions of the patient's history were reviewed and updated as appropriate: allergies, current medications, past medical history, past social history, past surgical history and problem list.  Objective:  There were no vitals filed for this visit.  Physical Exam Constitutional:      Appearance: Normal appearance.  HENT:     Head: Normocephalic.     Mouth/Throat:     Mouth: Mucous membranes are moist.     Pharynx: Oropharynx is clear. No oropharyngeal exudate.  Genitourinary:    Penis: Normal.      Testes: Normal.     Comments: No lice, nits, or pest, no lesions or odor discharge.  Denies pain or tenderness with paplation of testicles.  No lesions, ulcers or masses present.    Musculoskeletal:     Cervical back: Normal range of motion.  Lymphadenopathy:      Cervical: No cervical adenopathy.  Skin:    General: Skin is warm and dry.     Findings: No bruising, erythema, lesion or rash.  Neurological:     Mental Status: He is alert and oriented to person, place, and time.  Psychiatric:        Mood and Affect: Mood normal.        Behavior: Behavior normal.      Assessment and Plan:  Alan Sherman is a 31 y.o. male presenting to the [facility] Department for STI screening  1. Screening examination for venereal disease Patient does have STI symptoms Patient accepted all screenings including  gram stain, urethral GC and declined bloodwork for HIV/RPR.  Patient meets criteria for HepB screening? Yes. Ordered? No - declined  Patient meets criteria for HepC screening? Yes. Ordered? No - declined  Recommended condom use with all sex Discussed importance of condom use for STI prevent  Discussed time line for [facility] results and that patient will be called with positive results and encouraged patient to call if he had not heard in 2 weeks Recommended returning for continued or worsening symptoms.   - Gram stain - Gonococcus culture  2. Prophylactic measure Pt reports taking Doxycycline incorrectly but did complete the medication, still has tingling in urethra, pt urinated ~ 30  mins. before appointment.  Discussed possibility of false negative result.  gram stain negative,  will treat for trich prophylactically.    - metroNIDAZOLE (FLAGYL) 500 MG tablet; Take 4 tablets (2,000 mg total) by mouth once for 1 dose.  Dispense: 4 tablet; Refill: 0     Return for as needed.  No future appointments.  Wendi Snipes, FNP

## 2021-09-25 LAB — GONOCOCCUS CULTURE

## 2021-11-14 ENCOUNTER — Ambulatory Visit: Payer: Self-pay | Admitting: Nurse Practitioner

## 2021-11-14 ENCOUNTER — Encounter: Payer: Self-pay | Admitting: Nurse Practitioner

## 2021-11-14 DIAGNOSIS — Z202 Contact with and (suspected) exposure to infections with a predominantly sexual mode of transmission: Secondary | ICD-10-CM

## 2021-11-14 DIAGNOSIS — Z113 Encounter for screening for infections with a predominantly sexual mode of transmission: Secondary | ICD-10-CM

## 2021-11-14 LAB — GRAM STAIN

## 2021-11-14 LAB — HM HIV SCREENING LAB: HM HIV Screening: NEGATIVE

## 2021-11-14 LAB — HEPATITIS B SURFACE ANTIGEN: Hepatitis B Surface Ag: NONREACTIVE

## 2021-11-14 LAB — HM HEPATITIS C SCREENING LAB: HM Hepatitis Screen: NEGATIVE

## 2021-11-14 MED ORDER — CEFTRIAXONE SODIUM 500 MG IJ SOLR
500.0000 mg | Freq: Once | INTRAMUSCULAR | Status: AC
  Administered 2021-11-14: 500 mg via INTRAMUSCULAR

## 2021-11-14 NOTE — Progress Notes (Signed)
[facility] Department ?STI clinic/screening visit ? ?Subjective:  ?Alan Sherman is a 31 y.o. male being seen today for an STI screening visit. The patient reports they do have symptoms.   ? ?Patient has the following medical conditions:  There are no problems to display for this patient. ? ? ? ?Chief Complaint  ?Patient presents with  ? SEXUALLY TRANSMITTED DISEASE  ? ? ?HPI ? ?Patient reports to clinic today for STD screening.  Patient reports that his girlfriend informed him that she was positive for Gonorrhea.  Patient also reports genital itching, discharge, and sore throat for 2 days.   ? ?Does the patient or their partner desires a pregnancy in the next year? No ? ?Screening for MPX risk: ?Does the patient have an unexplained rash? No ?Is the patient MSM? No ?Does the patient endorse multiple sex partners or anonymous sex partners? No ?Did the patient have close or sexual contact with a person diagnosed with MPX? No ?Has the patient traveled outside the [place] where MPX is endemic? No ?Is there a high clinical suspicion for MPX-- evidenced by one of the following No ? -Unlikely to be chickenpox ? -Lymphadenopathy ? -Rash that present in same phase of evolution on any given body part ? ? ?See flowsheet for further details and programmatic requirements.  ? ? ?The following portions of the patient's history were reviewed and updated as appropriate: allergies, current medications, past medical history, past social history, past surgical history and problem list. ? ?Objective:  ?There were no vitals filed for this visit. ? ?Physical Exam ?Constitutional:   ?   Appearance: Normal appearance.  ?HENT:  ?   Head: Normocephalic.  ?   Right Ear: External ear normal.  ?   Left Ear: External ear normal.  ?   Nose: Nose normal.  ?   Mouth/Throat:  ?   Mouth: Mucous membranes are moist.  ?   Comments: No visible signs of dental caries  ?Pulmonary:  ?   Effort: Pulmonary effort is normal.  ?Abdominal:  ?   General:  Abdomen is flat.  ?   Palpations: Abdomen is soft.  ?Genitourinary: ?   Penis: Circumcised.   ?   Comments: Pubic area without nits, lice, hair loss, edema, erythema, lesions and inguinal adenopathy. ?Penis without rash, lesions and discharge at meatus. ?Testicles descended bilaterally,nt, no masses or edema.  ?Musculoskeletal:  ?   Cervical back: Full passive range of motion without pain, normal range of motion and neck supple.  ?Skin: ?   General: Skin is warm and dry.  ?Neurological:  ?   Mental Status: He is alert and oriented to person, place, and time.  ?Psychiatric:     ?   Attention and Perception: Attention normal.     ?   Mood and Affect: Mood normal.     ?   Speech: Speech normal.     ?   Behavior: Behavior is cooperative.  ? ? ? ? ?Assessment and Plan:  ?Alan Sherman is a 31 y.o. male presenting to the [facility] Department for STI screening ? ?1. Screening examination for venereal disease ?-31 year old male in clinic today for STD screening.  ?-Patient does have STI symptoms ?Patient accepted all screenings including  oral GC and bloodwork for HIV/RPR, HBV/HCV.   ?Patient meets criteria for HepB screening? Yes. Ordered? Yes ?Patient meets criteria for HepC screening? Yes. Ordered? Yes ?Recommended condom use with all sex ?Discussed importance of condom  use for STI prevent ? ?Treat gram stain per standing order ?Discussed time line for State Lab results and that patient will be called with positive results and encouraged patient to call if he had not heard in 2 weeks ?Recommended returning for continued or worsening symptoms.   ?- Gonococcus culture ?- Gonococcus culture ?- Gram stain ?- HIV/HCV [place] [facility] ?- HBV Antigen/Antibody [facility] ?- Syphilis Serology, [place] [facility] ? ?2. Gonorrhea contact ?-Treat as a contact for Gonorrhea.  Advised not to have sex for 7 days and 7 days after partner treated.  Advised to use condoms with all sex.   ?- cefTRIAXone (ROCEPHIN) injection 500  mg ? ? ? ? ?Return if symptoms worsen or fail to improve. ? ? ?Glenna Fellows, FNP ? ?

## 2021-11-14 NOTE — Progress Notes (Signed)
Pt here for STD screening and treatment of Gonorrhea.  Ceftriaxone 500 mg given IM in Rt Deltoid.  Berdie Ogren, RN ? ?

## 2021-11-19 LAB — GONOCOCCUS CULTURE

## 2022-04-05 ENCOUNTER — Ambulatory Visit: Payer: Self-pay | Admitting: Family Medicine

## 2022-04-05 ENCOUNTER — Encounter: Payer: Self-pay | Admitting: Family Medicine

## 2022-04-05 DIAGNOSIS — Z113 Encounter for screening for infections with a predominantly sexual mode of transmission: Secondary | ICD-10-CM

## 2022-04-05 DIAGNOSIS — Z202 Contact with and (suspected) exposure to infections with a predominantly sexual mode of transmission: Secondary | ICD-10-CM

## 2022-04-05 LAB — HM HEPATITIS C SCREENING LAB: HM Hepatitis Screen: NEGATIVE

## 2022-04-05 LAB — HM HIV SCREENING LAB: HM HIV Screening: NEGATIVE

## 2022-04-05 LAB — GRAM STAIN

## 2022-04-05 MED ORDER — DOXYCYCLINE HYCLATE 100 MG PO TABS: 100.0000 mg | ORAL_TABLET | Freq: Two times a day (BID) | ORAL | 0 refills | Status: DC

## 2022-04-05 MED ORDER — DOXYCYCLINE HYCLATE 100 MG PO TABS: 100.0000 mg | ORAL_TABLET | Freq: Two times a day (BID) | ORAL | 0 refills | Status: AC

## 2022-04-05 NOTE — Progress Notes (Signed)
[facility] Department STI clinic/screening visit  Subjective:  Alan Sherman is a 31 y.o. male being seen today for an STI screening visit. The patient reports they do have symptoms.    Patient has the following medical conditions:  There are no problems to display for this patient.    Chief Complaint  Patient presents with   SEXUALLY TRANSMITTED DISEASE    Screening and treatment for chlamydia patient complains of discharge     HPI  Patient reports he is here for STD screening and treatment for contact to chlamydia.  Client states that he has clear discharge.  Does the patient or their partner desires a pregnancy in the next year? No  Screening for MPX risk: Does the patient have an unexplained rash? No Is the patient MSM? No Does the patient endorse multiple sex partners or anonymous sex partners? No Did the patient have close or sexual contact with a person diagnosed with MPX? No Has the patient traveled outside the [place] where MPX is endemic? No Is there a high clinical suspicion for MPX-- evidenced by one of the following No  -Unlikely to be chickenpox  -Lymphadenopathy  -Rash that present in same phase of evolution on any given body part   See flowsheet for further details and programmatic requirements.    There is no immunization history on file for this patient.   The following portions of the patient's history were reviewed and updated as appropriate: allergies, current medications, past medical history, past social history, past surgical history and problem list.  Objective:  There were no vitals filed for this visit.  Physical Exam Constitutional:      Appearance: Normal appearance.  HENT:     Head: Normocephalic and atraumatic.     Comments: No nits or hair loss    Mouth/Throat:     Mouth: Mucous membranes are moist.     Pharynx: Oropharynx is clear. No oropharyngeal exudate or posterior oropharyngeal erythema.  Pulmonary:     Effort:  Pulmonary effort is normal.  Abdominal:     General: Abdomen is flat.     Palpations: Abdomen is soft. There is no hepatomegaly or mass.     Tenderness: There is no abdominal tenderness.  Genitourinary:    Pubic Area: No rash or pubic lice (no nits).      Penis: Circumcised. Discharge present. No tenderness, swelling or lesions.      Testes: Normal.     Epididymis:     Right: Normal.     Left: Normal.     Comments: Penis- small amount of clear discharge Lymphadenopathy:     Head:     Right side of head: No preauricular or posterior auricular adenopathy.     Left side of head: No preauricular or posterior auricular adenopathy.     Cervical: No cervical adenopathy.     Upper Body:     Right upper body: No supraclavicular, axillary or epitrochlear adenopathy.     Left upper body: No supraclavicular, axillary or epitrochlear adenopathy.     Lower Body: No right inguinal adenopathy. No left inguinal adenopathy.  Skin:    General: Skin is warm and dry.     Findings: No rash.  Neurological:     Mental Status: He is alert and oriented to person, place, and time.       Assessment and Plan:  Alan Sherman is a 31 y.o. male presenting to the [facility] Department for STI screening  1. Screening examination for venereal disease  - HIV/HCV [facility] - Syphilis Serology, [place] [facility] - Chlamydia/Gonorrhea [place] [facility] - Gram stain  2. Chlamydia contact  - doxycycline (VIBRA-TABS) 100 MG tablet; Take 1 tablet (100 mg total) by mouth 2 (two) times daily for 7 days.  Dispense: 14 tablet; Refill: 0 Co to abstain from sexual activity x 2 weeks and always use condoms for STD prevention.    Return if symptoms worsen or fail to improve.  No future appointments.  Larene Pickett, FNP

## 2022-04-10 ENCOUNTER — Ambulatory Visit: Payer: Self-pay

## 2022-10-05 ENCOUNTER — Emergency Department: Payer: BC Managed Care – PPO

## 2022-10-05 ENCOUNTER — Emergency Department
Admission: EM | Admit: 2022-10-05 | Discharge: 2022-10-05 | Disposition: A | Discharge status: Home / Self Care | Payer: BC Managed Care – PPO | Attending: Emergency Medicine | Admitting: Emergency Medicine

## 2022-10-05 ENCOUNTER — Encounter: Payer: Self-pay | Admitting: Emergency Medicine

## 2022-10-05 DIAGNOSIS — W2105XA Struck by basketball, initial encounter: Secondary | ICD-10-CM | POA: Diagnosis not present

## 2022-10-05 DIAGNOSIS — Y9367 Activity, basketball: Secondary | ICD-10-CM | POA: Insufficient documentation

## 2022-10-05 DIAGNOSIS — M79645 Pain in left finger(s): Secondary | ICD-10-CM | POA: Diagnosis present

## 2022-10-05 DIAGNOSIS — S63045A Dislocation of carpometacarpal joint of left thumb, initial encounter: Secondary | ICD-10-CM | POA: Diagnosis not present

## 2022-10-05 MED ORDER — MELOXICAM 15 MG PO TABS: 15.0000 mg | ORAL_TABLET | Freq: Every day | ORAL | 0 refills | Status: AC

## 2022-10-05 NOTE — ED Provider Notes (Signed)
[facility] Provider Note  Patient Contact: 4:07 PM (approximate)   History   Hand Injury   HPI  Alan Sherman is a 32 y.o. male who presents emergency department complaining of left thumb pain.  Patient was playing basketball when it was jammed.  He states that the digit was pointed backwards, states that he grabbed it, felt a clunk as he pulled it straight.  Patient states that he went from not being on to move his thumb to moving his thumb appropriately.  As he was sitting there after this injury however the thumbs are becoming more painful and swollen so he presents to the ED for evaluation.  No open wounds.  No other complaints.     Physical Exam   Triage Vital Signs: ED Triage Vitals  Enc Vitals Group     BP 10/05/22 1518 (!) 145/100     Pulse Rate 10/05/22 1518 66     Resp 10/05/22 1518 18     Temp 10/05/22 1518 (!) 97.5 F (36.4 C)     Temp Source 10/05/22 1518 Oral     SpO2 10/05/22 1518 99 %     Weight --      Height --      Head Circumference --      Peak Flow --      Pain Score 10/05/22 1517 8     Pain Loc --      Pain Edu? --      Excl. in [place]? --     Most recent vital signs: Vitals:   10/05/22 1518  BP: (!) 145/100  Pulse: 66  Resp: 18  Temp: (!) 97.5 F (36.4 C)  SpO2: 99%     General: Alert and in no acute distress.  Cardiovascular:  Good peripheral perfusion Respiratory: Normal respiratory effort without tachypnea or retractions. Lungs CTAB.  Musculoskeletal: Full range of motion to all extremities.  Visualization of the left reveals edema about the MCP joint without deformity.  Patient with no open wounds.  Able to flex and extend the distal phalanx appropriately.  Sensation capillary refill intact. Neurologic:  No gross focal neurologic deficits are appreciated.  Skin:   No rash noted Other:   ED Results / Procedures / Treatments   Labs (all labs ordered are listed, but only abnormal results are  displayed) Labs Reviewed - No data to display   EKG     RADIOLOGY  I personally viewed, evaluated, and interpreted these images as part of my medical decision making, as well as reviewing the written report by the radiologist.  ED Provider Interpretation: No evidence of fracture no residual dislocation of the MCP joint of the thumb.  DG Hand Complete Left  Result Date: 10/05/2022 CLINICAL DATA:  Injury. Deformity and swelling. Injury pain while playing basketball. Swelling and pain near base of thumb. EXAM: LEFT HAND - COMPLETE 3+ VIEW COMPARISON:  None Available. FINDINGS: There is moderate soft tissue swelling around the thumb carpometacarpal joint. Normal bone mineralization. Joint spaces are preserved. Neutral ulnar variance. No acute fracture is seen. No dislocation. IMPRESSION: Moderate soft tissue swelling around the thumb carpometacarpal joint. No acute fracture. Electronically Signed   By: Yvonne Kendall M.D.   On: 10/05/2022 15:49    PROCEDURES:  Critical Care performed: No  Procedures   MEDICATIONS ORDERED IN ED: Medications - No data to display   IMPRESSION / MDM / [place] / ED COURSE  I reviewed the triage vital  signs and the nursing notes.                                 Differential diagnosis includes, but is not limited to,, dislocation, thumb fracture, jammed interphalangeal digit  Patient's presentation is most consistent with acute presentation with potential threat to life or bodily function.   Patient's diagnosis is consistent with dislocated thumb.  Patient presents emergency department with an injury to the left thumb.  Was playing basketball and states that the thumb was bent backwards.  When patient looked down and said that the thumb was pointing the opposite direction when it was supposed to be.  He states that he grabbed his thumb, felt a sharp clunk as he pulled it forward.  Patient states that after this he was able to flex and extend  the finger.  Started to have some increased pain and swelling about the MCP joint and presents for evaluation.  No evidence of residual dislocation.  No evidence of fracture.  Anti-inflammatory, Velcro brace.  Follow-up with orthopedics as needed..  Patient is given ED precautions to return to the ED for any worsening or new symptoms.     FINAL CLINICAL IMPRESSION(S) / ED DIAGNOSES   Final diagnoses:  Dislocation of carpometacarpal joint of left thumb, initial encounter     Rx / DC Orders   ED Discharge Orders          Ordered    meloxicam (MOBIC) 15 MG tablet  Daily        10/05/22 1611    meloxicam (MOBIC) 15 MG tablet  Daily        10/05/22 1619             Note:  This document was prepared using Dragon voice recognition software and may include unintentional dictation errors.   Brynda Peon 10/05/22 1620    Rada Hay, MD 10/05/22 1929

## 2022-10-05 NOTE — ED Triage Notes (Signed)
Pt playing basketball and ran into someone. Left thumb was bent back and he popped it back in. Thumb joint is severely swollen and pt in pain.

## 2022-11-03 ENCOUNTER — Ambulatory Visit: Payer: Self-pay

## 2023-12-27 ENCOUNTER — Ambulatory Visit: Payer: Self-pay | Admitting: Family Medicine

## 2023-12-27 DIAGNOSIS — Z202 Contact with and (suspected) exposure to infections with a predominantly sexual mode of transmission: Secondary | ICD-10-CM | POA: Insufficient documentation

## 2023-12-27 DIAGNOSIS — Z113 Encounter for screening for infections with a predominantly sexual mode of transmission: Secondary | ICD-10-CM

## 2023-12-27 LAB — HM HEPATITIS C SCREENING LAB: HM Hepatitis Screen: NEGATIVE

## 2023-12-27 LAB — HM HIV SCREENING LAB: HM HIV Screening: NEGATIVE

## 2023-12-27 MED ORDER — PENICILLIN G BENZATHINE 2400000 UNIT/4ML IM SUSY
2.4000 10*6.[IU] | PREFILLED_SYRINGE | Freq: Once | INTRAMUSCULAR | Status: AC
  Administered 2023-12-27: 2400000 [IU] via INTRAMUSCULAR

## 2023-12-27 NOTE — Patient Instructions (Signed)
 STI screening - Today we obtained a urine sample to screen for gonorrhea and chlamydia - We also obtained a blood sample to screen for HIV, syphilis, Hepatitis B, and Hepatitis C  - If the results are abnormal, I will give you a call.    Estimated time frame for results collected at the [facility] Department: Same day Trichomonas Yeast BV (bacterial vaginosis)  Within 1-2 weeks Gonorrhea Chlamydia  Within 2-3 weeks HIV Syphilis Hepatitis B Hepatitis C

## 2023-12-27 NOTE — Assessment & Plan Note (Signed)
 Will treat with bicillin 2.4 million units x1 today for contact. 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

## 2023-12-27 NOTE — Progress Notes (Signed)
 [facility] Department STI clinic [address] [place] [address] Main phone: [phone]  STI screening visit  Subjective:  Alan Sherman is a 33 y.o. male being seen today for an STI screening visit. The patient reports they do not have symptoms.    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 Does not know who his contact is or when his exposure would have been. Last RPR testing on file from 11/14/2021 - negative. No known chancre or rashes. Denies lymphadenopathy, fever, chills, rash, lesions, penile discharge.   See flowsheet for further details and programmatic requirements  Hyperlink available at the top of the signed note in blue.  Flow sheet content below:  Pregnancy Intention Screening Does the patient want to become pregnant in the next year?: No Does the patient's partner want to become pregnant in the next year?: No Would the patient like to discuss contraceptive options today?: N/A All Patients Anyone smoke around pt and/or pt's children?: No Anyone smoke inside pt's house?: No Anyone smoke inside car?: No Anyone smoke inside the workplace?: No Reason For STD Screen STD Screening: Is asymptomatic Have you ever had an STD?: Yes History of Antibiotic use in the past 2 weeks?: No STD Symptoms Denies all: Yes Risk Factors for Hep B Household, sexual, or needle sharing contact of a person infected with Hep B: No Sexual contact with a person who uses drugs not as prescribed?: No Currently or Ever used drugs not as prescribed: No HIV Positive: No PRep Patient: No Men who have sex with men: N/A Have Hepatitis C: No History of Incarceration: Yes History of Homeslessness?: No Anal sex following anal drug use?: N/A Risk  Factors for Hep C Currently using drugs not as prescribed: No Sexual partner(s) currently using drugs as not prescribed: No History of drug use: No HIV Positive: No People with a history of incarceration: Yes People born between the years of 52 and 54: No Abuse History Has patient ever been abused physically?: No Has patient ever been abused sexually?: No Does patient feel they have a problem with Anxiety?: No Does patient feel they have a problem with Depression?: No Referral to Behavioral Health: No Counseling Patient counseled to use condoms with all sex: Condoms declined RTC in 2-3 weeks for test results: Yes Clinic will call if test results abnormal before test result appt.: Yes Test results given to patient Patient counseled to use condoms with all sex: Condoms declined  Screening for MPX risk: Does the patient have an unexplained rash? No Is the patient MSM? No Does the patient endorse multiple sex partners or anonymous sex partners? Yes Did the patient have close or sexual contact with a person diagnosed with MPX? No Has the patient traveled outside the [place]  where MPX is endemic? No Is there a high clinical suspicion for MPX-- evidenced by one of the following No  -Unlikely to be chickenpox  -Lymphadenopathy  -Rash that present in same phase of evolution on any given body part  STI screening history: Last HIV test per patient/review of record was  Lab Results  Component Value Date   HMHIVSCREEN Negative - Validated 04/05/2022    Lab Results  Component Value Date   HIV NON REACTIVE 05/24/2017   Last HEPC  test per patient/review of record was  Lab Results  Component Value Date   HMHEPCSCREEN Negative-Validated 04/05/2022   No components found for: "HEPC"   Last HEPB test per patient/review of record was No components found for: "HMHEPBSCREEN"   Fertility: Does the patient or their partner desires a pregnancy in the next year? No  There is no immunization  history on file for this patient.  The following portions of the patient's history were reviewed and updated as appropriate: allergies, current medications, past medical history, past social history, past surgical history and problem list.  Objective:  There were no vitals filed for this visit.  Physical Exam Vitals and nursing note reviewed. Exam conducted with a chaperone present Leodis Rainwater, NP student).  Constitutional:      Appearance: Normal appearance.  HENT:     Head: Normocephalic and atraumatic.     Mouth/Throat:     Mouth: Mucous membranes are moist.     Pharynx: No oropharyngeal exudate or posterior oropharyngeal erythema.  Eyes:     General: No scleral icterus.       Right eye: No discharge.        Left eye: No discharge.     Conjunctiva/sclera: Conjunctivae normal.     Right eye: Right conjunctiva is not injected. No exudate.    Left eye: Left conjunctiva is not injected. No exudate. Pulmonary:     Effort: Pulmonary effort is normal.  Abdominal:     General: Abdomen is flat.     Palpations: Abdomen is soft. There is no hepatomegaly or mass.     Tenderness: There is no abdominal tenderness. There is no rebound.  Genitourinary:    Comments: No genital exam- asymptomatic Musculoskeletal:        General: Normal range of motion.     Cervical back: Neck supple. No rigidity or tenderness.  Lymphadenopathy:     Cervical: No cervical adenopathy.     Right cervical: No superficial or posterior cervical adenopathy.    Left cervical: No superficial or posterior cervical adenopathy.     Upper Body:     Right upper body: No supraclavicular adenopathy.     Left upper body: No supraclavicular adenopathy.  Skin:    General: Skin is warm and dry.     Capillary Refill: Capillary refill takes less than 2 seconds.     Coloration: Skin is not jaundiced or pale.     Findings: No bruising, erythema, lesion or rash.  Neurological:     General: No focal deficit present.     Mental  Status: He is alert and oriented to person, place, and time.  Psychiatric:        Mood and Affect: Mood normal.        Behavior: Behavior normal.    Assessment and Plan:  Alan Sherman is a 33 y.o. male presenting to the [facility] Department for STI screening  Syphilis contact Assessment & Plan: Will treat with bicillin 2.4 million units x1 today for contact. 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   Orders: -     Penicillin G Benzathine  Screening examination for venereal disease -     Chlamydia/GC NAA, Confirmation -     Syphilis Serology, [facility] [place] -     HIV/HCV [facility] -     HBV Antigen/Antibody [facility]  Patient does not  have STI symptoms Patient accepted the following screenings: HIV, RPR, Hep B, and Hep C Patient meets criteria for HepB screening? Yes. Ordered? yes Patient meets criteria for HepC screening? Yes. Ordered? yes Recommended condom use with all sex Discussed importance of condom use for STI prevention  Treat positive test results per standing order. Discussed time line for State Lab results and that patient will be called with positive results and encouraged patient to call if he had not heard in 2 weeks Recommended repeat testing in 3 months with positive results. Recommended returning for continued or worsening symptoms.   No follow-ups on file.  No future appointments.  Jack Marts, MD

## 2023-12-27 NOTE — Progress Notes (Unsigned)
 Pt is here for STD screening and report being contact to syphilis. Bicillin 2.4MU injection given to pt at the RUOQ+LUOQ 1.2MU Each. Pt tolerated well to injection and opportunity given to pt as questions for any clarifications. Austine Lefort, RN.

## 2023-12-29 LAB — HBV ANTIGEN/ANTIBODY STATE LAB
Hep B Core Total Ab: NONREACTIVE
Hepatitis B Surface Antigen: NONREACTIVE

## 2023-12-31 LAB — CHLAMYDIA/GC NAA, CONFIRMATION
Chlamydia trachomatis, NAA: NEGATIVE
Neisseria gonorrhoeae, NAA: NEGATIVE

## 2024-02-22 ENCOUNTER — Emergency Department: Payer: Self-pay

## 2024-02-22 ENCOUNTER — Emergency Department
Admission: EM | Admit: 2024-02-22 | Discharge: 2024-02-22 | Disposition: A | Discharge status: Home / Self Care | Payer: Self-pay | Attending: Emergency Medicine | Admitting: Emergency Medicine

## 2024-02-22 DIAGNOSIS — X501XXA Overexertion from prolonged static or awkward postures, initial encounter: Secondary | ICD-10-CM | POA: Insufficient documentation

## 2024-02-22 DIAGNOSIS — M25561 Pain in right knee: Secondary | ICD-10-CM | POA: Insufficient documentation

## 2024-02-22 DIAGNOSIS — Y9367 Activity, basketball: Secondary | ICD-10-CM | POA: Insufficient documentation

## 2024-02-22 NOTE — Discharge Instructions (Signed)
 You can take 650 mg of Tylenol  or 400 mg ibuprofen  every 6 hours as needed for pain.  Please make sure to keep your legs elevated, you can use ice, please avoid any activity that could reinjure your knee.  Please make sure to follow-up with orthopedic surgery for further management of your symptoms.

## 2024-02-22 NOTE — ED Provider Notes (Signed)
 Alan Sherman Provider Note    Event Date/Time   First MD Initiated Contact with Patient 02/22/24 1656     (approximate)   History   Knee Injury   HPI  Alan Sherman is a 33 y.o. male presenting with right knee pain.  Patient states that he was playing basketball, had jumped up to down, landed on his feet, heard a pop.  Is ambulatory, able to weight-bear but having knee pain.  States that has been using a knee brace that helps with the pain.  Has been taking Tylenol  ibuprofen  at home.  No open wounds, did not fall and hit his head.  This incident occurred 2 weeks ago.  States that he had prior knee surgery in the past for the same thing.  States that that was a long time ago.      Physical Exam   Triage Vital Signs: ED Triage Vitals [02/22/24 1438]  Encounter Vitals Group     BP (!) 125/59     Girls Systolic BP Percentile      Girls Diastolic BP Percentile      Boys Systolic BP Percentile      Boys Diastolic BP Percentile      Pulse Rate 73     Resp 16     Temp 98.5 F (36.9 C)     Temp Source Oral     SpO2 100 %     Weight 185 lb (83.9 kg)     Height 5' 9 (1.753 m)     Head Circumference      Peak Flow      Pain Score 5     Pain Loc      Pain Education      Exclude from Growth Chart     Most recent vital signs: Vitals:   02/22/24 1438  BP: (!) 125/59  Pulse: 73  Resp: 16  Temp: 98.5 F (36.9 C)  SpO2: 100%     General: Awake, no distress.  CV:  Good peripheral perfusion.  Resp:  Normal effort.  Abd:  No distention.  Other:  Full range of motion of right lower extremity is intact, equal DP pulses bilaterally, no focal weakness or numbness, he is able to flex and extend against resistance at the right knee.  Tender over the patella tendon but I do not feel a defect, no obvious laxity.  Does also have some tenderness to the right lateral knee.  No tenderness to the hip or the ankle.  No overlying erythema, swelling.   ED  Results / Procedures / Treatments   Labs (all labs ordered are listed, but only abnormal results are displayed) Labs Reviewed - No data to display   RADIOLOGY On my independent interpretation, x-ray without obvious fracture   PROCEDURES:  Critical Care performed: No  Procedures   MEDICATIONS ORDERED IN ED: Medications - No data to display   IMPRESSION / MDM / ASSESSMENT AND PLAN / ED COURSE  I reviewed the triage vital signs and the nursing notes.                              Differential diagnosis includes, but is not limited to, strain, sprain, meniscal tear, tendon injury, ligamentous injury.  Did consider fracture but patient is weightbearing.  Doubt dislocation.  X-ray of the knee was done out of triage.  Patient's presentation is most consistent with acute, uncomplicated  illness.  Independent interpretation of imaging below.  Discussed with patient about knee immobilizer, he says he does not need one here since he has 1 at home.  Offered Tylenol  ibuprofen  here but he states that he can go home to take some.  Will give a number to call for orthopedic surgery to follow-up.  Considered but no indication for inpatient admission at this time, he safe for outpatient management.  Will discharge with strict return precautions.  Shared decision making to patient he is agreeable with this plan.    Clinical Course as of 02/22/24 1730  Fri Feb 22, 2024  1701 DG Knee Complete 4 Views Right Negative radiograph of the right knee.  [TT]    Clinical Course User Index [TT] Waymond, Lorelle Cummins, MD     FINAL CLINICAL IMPRESSION(S) / ED DIAGNOSES   Final diagnoses:  Acute pain of right knee     Rx / DC Orders   ED Discharge Orders     None        Note:  This document was prepared using Dragon voice recognition software and may include unintentional dictation errors.    Waymond Lorelle Cummins, MD 02/22/24 [id number]

## 2024-02-22 NOTE — ED Triage Notes (Signed)
 Pt c/o R knee pain x2 weeks.  Pain score 5/10 increasing w/ movement.  Pt reports hurting his knee while playing basketball.  Pt noted to walk gingerly to triage room, but w/o assistance.  Pt reports possibly tearing cartilage when he was younger and he never got it fixed.

## 2024-02-22 NOTE — ED Provider Triage Note (Signed)
 Emergency Medicine Provider Triage Evaluation Note  Alan Sherman , a 33 y.o. male  was evaluated in triage.  Pt complains of right knee pain x 2 weeks. Hurt while playing basketball. No fever or chills.   Physical Exam  BP (!) 125/59 (BP Location: Left Arm)   Pulse 73   Temp 98.5 F (36.9 C) (Oral)   Resp 16   Ht 5' 9 (1.753 m)   Wt 83.9 kg   SpO2 100%   BMI 27.32 kg/m  Gen:   Awake, no distress   Resp:  Normal effort  MSK:   Moves extremities without difficulty  Other:  TTP diffusely on right knee.  Medical Decision Making  Medically screening exam initiated at 2:45 PM.  Appropriate orders placed.  Alan Sherman was informed that the remainder of the evaluation will be completed by another provider, this initial triage assessment does not replace that evaluation, and the importance of remaining in the ED until their evaluation is complete.  Right knee x ray   Sheron Salm, PA-C 02/22/24 1447

## 2024-02-22 NOTE — ED Notes (Signed)
Pt left before DC vitals could be obtained

## 2024-05-23 ENCOUNTER — Ambulatory Visit: Payer: Self-pay

## 2024-05-23 DIAGNOSIS — Z113 Encounter for screening for infections with a predominantly sexual mode of transmission: Secondary | ICD-10-CM

## 2024-05-23 DIAGNOSIS — R369 Urethral discharge, unspecified: Secondary | ICD-10-CM | POA: Insufficient documentation

## 2024-05-23 DIAGNOSIS — A549 Gonococcal infection, unspecified: Secondary | ICD-10-CM

## 2024-05-23 LAB — GRAM STAIN

## 2024-05-23 MED ORDER — CEFTRIAXONE SODIUM 500 MG IJ SOLR
500.0000 mg | Freq: Once | INTRAMUSCULAR | Status: AC
  Administered 2024-05-23: 500 mg via INTRAMUSCULAR

## 2024-05-23 NOTE — Progress Notes (Unsigned)
 Pt is here for STD screening. Gram stain(+), WBC>2hpf. Per SO patient was given Ceftriaxone  500 mg IM injection at the RUOQ  and patient tolerated well to injection. Patient was given the opportunity to ask questions for any clarifications. Questions answered. Condoms and contact card given. Wilkie Drought, RN.

## 2024-05-23 NOTE — Patient Instructions (Signed)
 STI screening - Today we obtained a penile swab to screen for gonorrhea and oral swab to screen for gonorrhea - If the results are abnormal, I will give you a call.    Estimated time frame for results collected at the [facility] Department: Same day Trichomonas Yeast BV (bacterial vaginosis)  Within 3-5 days Gonorrhea Chlamydia  Within 1 week HIV Syphilis Hepatitis B Hepatitis C

## 2024-05-23 NOTE — Progress Notes (Signed)
 [facility] Department STI clinic [address] [place] [address] Main phone: [phone]  STI screening visit  Subjective:  Alan Sherman is a 33 y.o. male being seen today for an STI screening visit. The patient reports they do have symptoms.    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 No rashes, sores, or lymphadenopathy. No fever or chills. No known STI contact aside from a bacterial infection.   Smokes 1/2 PPD - politely declines resources at this time.   See flowsheet for further details and programmatic requirements  Hyperlink available at the top of the signed note in blue.  Flow sheet content below:  Pregnancy Intention Screening Does the patient want to become pregnant in the next year?: No Does the patient's partner want to become pregnant in the next year?: No Would the patient like to discuss contraceptive options today?: N/A All Patients Anyone smoke around pt and/or pt's children?: Yes Anyone smoke inside pt's house?: Yes Anyone smoke inside car?: Yes Anyone smoke inside the workplace?: Yes Reason For STD Screen STD Screening: Has symptoms Have you ever had an STD?: Yes History of Antibiotic use in the past 2 weeks?: No Abuse History Referral to Behavioral Health: No Counseling Patient counseled to use condoms with all sex: Condoms given RTC in 2-3 weeks for test results: Yes Clinic will call if test results abnormal before test result appt.: Yes Test results given to patient Patient counseled to use condoms with all sex: Condoms given  Screening for MPX risk:  Unexplained rash?  No   MSM?  No   Multiple or anonymous sex partners?  No   Any close or  sexual contact with a person  diagnosed with MPX?  No   Any outside the [place]  where MPX is endemic?  No   High clinical suspicion for MPX?    -Unlikely to be chickenpox    -Lymphadenopathy    -Rash that presents in same phase of       evolution on any given body part  No   STI screening history: Last HIV test per patient/review of record was  Lab Results  Component Value Date   HMHIVSCREEN Negative - Validated 12/27/2023    Lab Results  Component Value Date   HIV NON REACTIVE 05/24/2017   Last HEPC test per patient/review of record was  Lab Results  Component Value Date   HMHEPCSCREEN Negative-Validated 12/27/2023   No components found for: HEPC   Last HEPB test per patient/review of record was No components found for: HMHEPBSCREEN   Fertility: Does the patient or their partner desires a pregnancy in the next year? No  There is no immunization history on file for this patient.  The following portions of the patient's history were reviewed and updated as appropriate: allergies, current medications, past medical history, past social history, past surgical history and problem list.  Objective:  There were no vitals filed for this visit.  Physical Exam Chaperone present: Paitent politely declines chaperone.  Constitutional:      Appearance: Normal appearance.  HENT:     Head: Normocephalic and atraumatic.     Comments: No nits or hair loss    Mouth/Throat:  Mouth: Mucous membranes are moist. No oral lesions.     Pharynx: Oropharynx is clear. No oropharyngeal exudate or posterior oropharyngeal erythema.  Eyes:     General:        Right eye: No discharge.        Left eye: No discharge.     Conjunctiva/sclera:     Right eye: Right conjunctiva is not injected. No exudate.    Left eye: Left conjunctiva is not injected. No exudate. Pulmonary:     Effort: Pulmonary effort is normal.  Abdominal:     General: Abdomen is flat.     Palpations: Abdomen is soft. There is  no hepatomegaly or mass.     Tenderness: There is no abdominal tenderness. There is no rebound.     Hernia: There is no hernia in the left inguinal area or right inguinal area.  Genitourinary:    Pubic Area: No rash or pubic lice (no nits).      Penis: Normal. No tenderness, discharge, swelling or lesions.      Testes: Normal.     Epididymis:     Right: Normal. No mass or tenderness.     Left: Normal. No mass or tenderness.     Tanner stage (genital): 5.     Rectum: Tenderness: no lesions or discharge.     Comments: Penile Discharge Amount: moderate Color:  yellow green Lymphadenopathy:     Head:     Right side of head: No preauricular or posterior auricular adenopathy.     Left side of head: No preauricular or posterior auricular adenopathy.     Cervical: No cervical adenopathy.     Upper Body:     Right upper body: No supraclavicular adenopathy.     Left upper body: No supraclavicular adenopathy.     Lower Body: No right inguinal adenopathy. No left inguinal adenopathy.  Skin:    General: Skin is warm and dry.     Findings: No lesion or rash.  Neurological:     Mental Status: He is alert and oriented to person, place, and time.    Assessment and Plan:  Alan Sherman is a 33 y.o. male presenting to the [facility] Department for STI screening  Gonorrhea -     cefTRIAXone  Sodium  Penile discharge -     Gram stain  Screening examination for venereal disease -     Gonococcus culture -     Chlamydia/GC NAA, Confirmation  Patient does have STI symptoms Patient accepted the following screenings: oral GC culture, penile gram stain for GC, and urine CT/GC Recommended condom use with all sex Discussed importance of condom use for STI prevention  Treat positive test results per standing order. Discussed time line for State Lab results and that patient will be called with positive results and encouraged patient to call if he had not heard in 2 weeks Recommended  repeat testing in 3 months with positive results. Recommended returning for continued or worsening symptoms.   No follow-ups on file.  No future appointments.  Betsey CHRISTELLA Helling, MD

## 2024-05-27 ENCOUNTER — Ambulatory Visit: Payer: Self-pay

## 2024-05-27 LAB — GONOCOCCUS CULTURE

## 2024-05-27 LAB — CHLAMYDIA/GC NAA, CONFIRMATION
Chlamydia trachomatis, NAA: NEGATIVE
Neisseria gonorrhoeae, NAA: POSITIVE — AB

## 2024-05-27 LAB — N. GONORRHOEAE NAA, CONFIRM: N. gonorrhoeae NAA, Confirm: POSITIVE — AB

## 2024-05-29 NOTE — Telephone Encounter (Signed)
 Msg sent to pt on mychart. Has no read msg at this time. Attempted to call pt. No answer. LMTRC.

## 2024-05-30 NOTE — Telephone Encounter (Signed)
 2nd attempt to contact pt regarding positive test results.No answer. [facility], MyChart message remains unread.

## 2024-06-04 NOTE — Progress Notes (Signed)
 Attempted to contact pt by phone and myChart message with no response. Mailed letter to pt.

## 2024-07-14 ENCOUNTER — Ambulatory Visit: Payer: Self-pay | Admitting: Family Medicine

## 2024-07-14 DIAGNOSIS — Z113 Encounter for screening for infections with a predominantly sexual mode of transmission: Secondary | ICD-10-CM

## 2024-07-14 DIAGNOSIS — A549 Gonococcal infection, unspecified: Secondary | ICD-10-CM

## 2024-07-14 DIAGNOSIS — A749 Chlamydial infection, unspecified: Secondary | ICD-10-CM

## 2024-07-14 LAB — GRAM STAIN

## 2024-07-14 MED ORDER — DOXYCYCLINE HYCLATE 100 MG PO TABS: 100.0000 mg | ORAL_TABLET | Freq: Two times a day (BID) | ORAL | Status: AC

## 2024-07-14 MED ORDER — CEFTRIAXONE SODIUM 500 MG IJ SOLR
500.0000 mg | Freq: Once | INTRAMUSCULAR | Status: AC
  Administered 2024-07-14: 500 mg via INTRAMUSCULAR

## 2024-07-14 NOTE — Progress Notes (Signed)
 [facility] Department STI clinic [address], [address] [place] [address] Main phone: [phone]  STI screening visit  Subjective:  Alan Sherman is a 33 y.o. male being seen today for an STI screening visit. The patient reports they do have symptoms.    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 Treated in clinic on 05/23/24 for gonorrhea. Reports his partner got treated and they waited 7 days to have sex. States the discharge went away but it returned about 1 week ago  Reproductive considerations: Does the patient or their partner desires a pregnancy in the next year? No  See flowsheet for further details and programmatic requirements  Hyperlink available at the top of the signed note in blue.  Flow sheet content below:  Pregnancy Intention Screening Does the patient want to become pregnant in the next year?: N/A Does the patient's partner want to become pregnant in the next year?: No Would the patient like to discuss contraceptive options today?: N/A All Patients Anyone smoke around pt and/or pt's children?: Yes Anyone smoke inside pt's house?: Yes Anyone smoke inside car?: Yes Anyone smoke inside the workplace?: Yes Reason For STD Screen STD Screening: Has symptoms Have you ever had an STD?: Yes History of Antibiotic use in the past 2 weeks?: No STD Symptoms Discharge: Yes Risk Factors for Hep B Household, sexual, or needle sharing contact of a person infected with Hep B: No Sexual contact with a person who uses drugs not as prescribed?: No Currently or Ever used drugs not as prescribed: No HIV Positive: No PRep Patient: No Men who have sex with men: No Have Hepatitis C: No History of  Incarceration: No History of Homeslessness?: No Anal sex following anal drug use?: No Risk Factors for Hep C Currently using drugs not as prescribed: No Sexual partner(s) currently using drugs as not prescribed: No History of drug use: No HIV Positive: No People with a history of incarceration: No People born between the years of 53 and 58: No Counseling Patient counseled to use condoms with all sex: Condoms given RTC in 2-3 weeks for test results: Yes Clinic will call if test results abnormal before test result appt.: Yes Test results given to patient Patient counseled to use condoms with all sex: Condoms given  Screening for MPX risk:  Unexplained rash?  No   MSM?  No   Multiple or anonymous sex partners?  No   Any close or sexual contact with a person  diagnosed with MPX?  No   Any outside the [place]  where MPX is endemic?  No   High clinical suspicion for MPX?    -Unlikely to be chickenpox    -Lymphadenopathy    -Rash that presents in same phase of       evolution on any given body part  No   Does this patient meet CDC recommendations for vaccination against MPOX? No  You already have or anticipate having the following risks:  Your sex partner has the following risks: You're traveling to a county with a clade I MPOX outbreak and anticipate these risks: Occupational exposure  You had known or suspected exposure to someone with monkeypox You had a sex partner in the past 2 weeks  who was diagnosed with monkeypox You are a gay, bisexual, or other man who has sex with men, or are transgender or nonbinary and in the past 6 months have had any of the following: - A new diagnosis of one or more sexually transmitted diseases (e.g., chlamydia, gonorrhea, or syphilis) - More than one sex partner You have had any of the following in the past 6 months: - Sex at a commercial sex venue (like a sex club or bathhouse) - Sex related to a large commercial event   or in a geographic area (city  or county for example) where mpox virus transmission is occurring Sex with a new partner Sex at a commercial sex venue (e.g., a sex club or bathhouse) Sex in it consultant for money, goods, drugs, or other trade Sex in association with a large public event (e.g., a rave, party, or festival) i.e. certain people who work in a laboratory or healthcare facility   Infectious disease screenings: Vaccinated against HPV? No  HIV Ever had a positive? No Last test: 12/2023 Results in chart:  Lab Results  Component Value Date   HMHIVSCREEN Negative - Validated 12/27/2023    Lab Results  Component Value Date   HIV NON REACTIVE 05/24/2017     Hep B Hep B status: unknown or no prior testing Received HBV vaccination? Unknown Received HBV testing for immunity? Unknown Results in chart:  No components found for: HMHEPBSCREEN  Do they qualify for HBV screening today? No Criteria:  -Household, sexual or needle sharing contact with HBV -History of drug use or homelessness -HIV positive -Those with known Hep C  Hep C Hep C status: negative on 12/2023 Results in chart:  Lab Results  Component Value Date   HMHEPCSCREEN Negative-Validated 12/27/2023   No components found for: HEPC  Do they qualify for HCV screening today? No Criteria - since the last HCV result, does the patient have any of the following? - Current drug use - Have a partner with drug use - Has been incarcerated  Immunization history:   There is no immunization history on file for this patient.  The following portions of the patient's history were reviewed and updated as appropriate: allergies, current medications, past medical history, past social history, past surgical history and problem list.  Substance use screenings:  Uses tobacco products? Yes Uses vapes? No Uses alcohol? Practitioner oversight forgot to ask Uses non-injectable substances that alter your mental status? No Uses non-prescribed injectable  substances? No   There is no immunization history on file for this patient.  The following portions of the patient's history were reviewed and updated as appropriate: allergies, current medications, past medical history, past social history, past surgical history and problem list.  Objective:  There were no vitals filed for this visit.  Physical Exam Exam conducted with a chaperone present Brett Orange CNA).  Constitutional:      Appearance: Normal appearance.  HENT:     Head: Normocephalic and atraumatic.     Comments: No nits or hair loss on scalp, brows, or lashes    Mouth/Throat:     Mouth: Mucous membranes are moist. No oral lesions.     Pharynx: Oropharynx is clear. No oropharyngeal exudate or posterior oropharyngeal erythema.  Eyes:     General:        Right eye: No discharge.        Left eye: No discharge.     Conjunctiva/sclera: Conjunctivae normal.     Right eye: Right conjunctiva is  not injected. No exudate.    Left eye: Left conjunctiva is not injected. No exudate. Pulmonary:     Effort: Pulmonary effort is normal.  Abdominal:     Palpations: There is no hepatomegaly.     Tenderness: There is no abdominal tenderness. There is no rebound.  Genitourinary:    Pubic Area: No rash or pubic lice (no nits).      Penis: Normal. No discharge or lesions.      Testes: Normal.     Epididymis:     Right: Normal. No mass or tenderness.     Left: Normal. No mass or tenderness.     Rectum: Tenderness: no lesions or discharge.     Comments: Penile Discharge Amount: small Color:  green/gray Lymphadenopathy:     Cervical: No cervical adenopathy.     Upper Body:     Right upper body: No supraclavicular, axillary or epitrochlear adenopathy.     Left upper body: No supraclavicular, axillary or epitrochlear adenopathy.     Lower Body: No right inguinal adenopathy. No left inguinal adenopathy.  Skin:    General: Skin is warm and dry.     Findings: No lesion or rash.   Neurological:     Mental Status: He is alert and oriented to person, place, and time.      Assessment and Plan:  Alan Sherman is a 33 y.o. male presenting to the [facility] Department for STI screening.  Patient accepted the following screenings: penile gram stain for GC and urine CT/GC  1. Screening for venereal disease (Primary)  - Chlamydia/GC NAA, Confirmation - Gram stain  2. Gonorrhea  - cefTRIAXone  (ROCEPHIN ) injection 500 mg  3. Chlamydia -reviewed options to wait for urine results vs presumptively treating for chlamydia today -pt desires to have treatment today  - doxycycline  (VIBRA -TABS) 100 MG tablet; Take 1 tablet (100 mg total) by mouth 2 (two) times daily for 7 days.    Counseling: Recommended condom use with all sex Discussed importance of condom use for STI prevention Discussed time line for [facility] results and that patient will be called with positive results and encouraged patient to call if they had not heard in 2 weeks Recommended repeat testing in 3 months with positive results. Recommended returning for continued or worsening symptoms.   Return if symptoms worsen or fail to improve, for STI screening.  No future appointments.  Verneta Bers, [place]

## 2024-07-14 NOTE — Progress Notes (Signed)
 Gram stain results reviewed and treatd per SO. The patient was dispensed #14 doxycycline  today. I provided counseling today regarding the medication. We discussed the medication, the side effects and when to call clinic. Patient given the opportunity to ask questions. Questions answered.  Rocephin  IM given in L deltoid and tolerated well.  Contact card given. Larraine JONELLE Novak, RN

## 2024-07-18 LAB — CHLAMYDIA/GC NAA, CONFIRMATION
Chlamydia trachomatis, NAA: NEGATIVE
Neisseria gonorrhoeae, NAA: POSITIVE — AB

## 2024-07-18 LAB — N. GONORRHOEAE NAA, CONFIRM: N. gonorrhoeae NAA, Confirm: POSITIVE — AB

## 2024-07-21 ENCOUNTER — Ambulatory Visit: Payer: Self-pay

## 2024-07-21 NOTE — Progress Notes (Signed)
 Reviewed. Gonorrhea positive and Chlamydia negative. Results from 07/14/2024.  Phone call to patient. ID verified.  Patient informed of results. Patient states he has viewed results on MyChart and has completed doxycycline  which was prescribed presumptively for treatment of chlamydia. RN explained that he was treated for gonorrhea with Ceftriaxone  injection while in clinic. No additional treatment needed for gonorrhea. Denies having sex since tx started and counseled no sex until after partner treated and to always use condoms for STI protection. Test of cure/rescreening recommended in 3 months, approx 10/2024. Questions answered and reports understanding. Anvita Hirata, RN
